# Patient Record
Sex: Female | Born: 1935 | Race: White | Hispanic: No | Smoking: Never smoker
Health system: Southern US, Community
[De-identification: ages and names within clinical notes are randomized; demographics above are authoritative.]

## PROBLEM LIST (undated history)

## (undated) DIAGNOSIS — G459 Transient cerebral ischemic attack, unspecified: Secondary | ICD-10-CM

## (undated) DIAGNOSIS — I10 Essential (primary) hypertension: Secondary | ICD-10-CM

## (undated) DIAGNOSIS — M1990 Unspecified osteoarthritis, unspecified site: Secondary | ICD-10-CM

## (undated) DIAGNOSIS — F0390 Unspecified dementia without behavioral disturbance: Secondary | ICD-10-CM

## (undated) DIAGNOSIS — I2510 Atherosclerotic heart disease of native coronary artery without angina pectoris: Secondary | ICD-10-CM

## (undated) DIAGNOSIS — I639 Cerebral infarction, unspecified: Secondary | ICD-10-CM

## (undated) DIAGNOSIS — E7800 Pure hypercholesterolemia, unspecified: Secondary | ICD-10-CM

## (undated) DIAGNOSIS — K219 Gastro-esophageal reflux disease without esophagitis: Secondary | ICD-10-CM

## (undated) HISTORY — PX: CORONARY ANGIOPLASTY WITH STENT PLACEMENT: SHX49

## (undated) HISTORY — PX: BACK SURGERY: SHX140

## (undated) HISTORY — PX: CHOLECYSTECTOMY: SHX55

## (undated) HISTORY — PX: TONSILLECTOMY: SUR1361

## (undated) HISTORY — PX: SPINAL FUSION: SHX223

## (undated) HISTORY — PX: ABDOMINAL HYSTERECTOMY: SHX81

## (undated) HISTORY — PX: FRACTURE SURGERY: SHX138

---

## 2004-08-26 ENCOUNTER — Ambulatory Visit: Payer: Self-pay

## 2005-01-20 ENCOUNTER — Ambulatory Visit: Payer: Self-pay

## 2005-01-21 ENCOUNTER — Ambulatory Visit: Payer: Self-pay | Admitting: Unknown Physician Specialty

## 2006-02-25 ENCOUNTER — Ambulatory Visit: Payer: Self-pay

## 2008-09-11 ENCOUNTER — Emergency Department: Payer: Self-pay | Admitting: Emergency Medicine

## 2010-02-05 ENCOUNTER — Ambulatory Visit: Payer: Self-pay

## 2010-06-18 ENCOUNTER — Emergency Department: Payer: Self-pay | Admitting: Emergency Medicine

## 2011-04-24 ENCOUNTER — Emergency Department: Payer: Self-pay | Admitting: Emergency Medicine

## 2011-04-24 LAB — URINALYSIS, COMPLETE
Glucose,UR: NEGATIVE mg/dL (ref 0–75)
Nitrite: POSITIVE
Ph: 5 (ref 4.5–8.0)
Protein: NEGATIVE
Specific Gravity: 1.011 (ref 1.003–1.030)
WBC UR: 8 /HPF (ref 0–5)

## 2011-04-24 LAB — COMPREHENSIVE METABOLIC PANEL
Anion Gap: 8 (ref 7–16)
BUN: 20 mg/dL — ABNORMAL HIGH (ref 7–18)
Bilirubin,Total: 0.5 mg/dL (ref 0.2–1.0)
Chloride: 108 mmol/L — ABNORMAL HIGH (ref 98–107)
Co2: 26 mmol/L (ref 21–32)
Creatinine: 0.91 mg/dL (ref 0.60–1.30)
EGFR (African American): >60
EGFR (Non-African Amer.): >60
Osmolality: 286 (ref 275–301)
Potassium: 4 mmol/L (ref 3.5–5.1)
SGPT (ALT): 16 U/L
Sodium: 142 mmol/L (ref 136–145)
Total Protein: 7.1 g/dL (ref 6.4–8.2)

## 2011-04-24 LAB — CBC
HCT: 37.5 % (ref 35.0–47.0)
MCHC: 33.7 g/dL (ref 32.0–36.0)
MCV: 98 fL (ref 80–100)
RBC: 3.83 10*6/uL (ref 3.80–5.20)
RDW: 14.3 % (ref 11.5–14.5)
WBC: 10.6 10*3/uL (ref 3.6–11.0)

## 2011-04-24 LAB — TROPONIN I: Troponin-I: <0.02 ng/mL

## 2012-03-09 ENCOUNTER — Emergency Department: Payer: Self-pay | Admitting: Emergency Medicine

## 2014-07-03 DIAGNOSIS — H02403 Unspecified ptosis of bilateral eyelids: Secondary | ICD-10-CM | POA: Diagnosis not present

## 2014-07-03 DIAGNOSIS — N3946 Mixed incontinence: Secondary | ICD-10-CM | POA: Diagnosis not present

## 2014-07-03 DIAGNOSIS — I10 Essential (primary) hypertension: Secondary | ICD-10-CM | POA: Diagnosis not present

## 2014-07-03 DIAGNOSIS — J309 Allergic rhinitis, unspecified: Secondary | ICD-10-CM | POA: Diagnosis not present

## 2014-07-03 DIAGNOSIS — F0280 Dementia in other diseases classified elsewhere without behavioral disturbance: Secondary | ICD-10-CM | POA: Diagnosis not present

## 2014-11-05 DIAGNOSIS — H02403 Unspecified ptosis of bilateral eyelids: Secondary | ICD-10-CM | POA: Diagnosis not present

## 2014-11-05 DIAGNOSIS — F0280 Dementia in other diseases classified elsewhere without behavioral disturbance: Secondary | ICD-10-CM | POA: Diagnosis not present

## 2014-11-05 DIAGNOSIS — N3946 Mixed incontinence: Secondary | ICD-10-CM | POA: Diagnosis not present

## 2014-11-05 DIAGNOSIS — J309 Allergic rhinitis, unspecified: Secondary | ICD-10-CM | POA: Diagnosis not present

## 2014-11-05 DIAGNOSIS — I10 Essential (primary) hypertension: Secondary | ICD-10-CM | POA: Diagnosis not present

## 2015-02-06 ENCOUNTER — Encounter: Payer: Self-pay | Admitting: Emergency Medicine

## 2015-02-06 ENCOUNTER — Emergency Department
Admission: EM | Admit: 2015-02-06 | Discharge: 2015-02-06 | Disposition: A | Discharge status: Home / Self Care | Payer: Commercial Managed Care - HMO | Attending: Student | Admitting: Student

## 2015-02-06 ENCOUNTER — Emergency Department: Payer: Commercial Managed Care - HMO

## 2015-02-06 DIAGNOSIS — S52021A Displaced fracture of olecranon process without intraarticular extension of right ulna, initial encounter for closed fracture: Secondary | ICD-10-CM

## 2015-02-06 DIAGNOSIS — Y9289 Other specified places as the place of occurrence of the external cause: Secondary | ICD-10-CM | POA: Diagnosis not present

## 2015-02-06 DIAGNOSIS — Z79899 Other long term (current) drug therapy: Secondary | ICD-10-CM | POA: Diagnosis not present

## 2015-02-06 DIAGNOSIS — R42 Dizziness and giddiness: Secondary | ICD-10-CM | POA: Insufficient documentation

## 2015-02-06 DIAGNOSIS — Y998 Other external cause status: Secondary | ICD-10-CM | POA: Diagnosis not present

## 2015-02-06 DIAGNOSIS — S52024A Nondisplaced fracture of olecranon process without intraarticular extension of right ulna, initial encounter for closed fracture: Secondary | ICD-10-CM | POA: Diagnosis not present

## 2015-02-06 DIAGNOSIS — S199XXA Unspecified injury of neck, initial encounter: Secondary | ICD-10-CM | POA: Diagnosis not present

## 2015-02-06 DIAGNOSIS — W01198A Fall on same level from slipping, tripping and stumbling with subsequent striking against other object, initial encounter: Secondary | ICD-10-CM | POA: Insufficient documentation

## 2015-02-06 DIAGNOSIS — R079 Chest pain, unspecified: Secondary | ICD-10-CM | POA: Diagnosis not present

## 2015-02-06 DIAGNOSIS — S0121XA Laceration without foreign body of nose, initial encounter: Secondary | ICD-10-CM | POA: Diagnosis not present

## 2015-02-06 DIAGNOSIS — I10 Essential (primary) hypertension: Secondary | ICD-10-CM | POA: Diagnosis not present

## 2015-02-06 DIAGNOSIS — Y9389 Activity, other specified: Secondary | ICD-10-CM | POA: Diagnosis not present

## 2015-02-06 DIAGNOSIS — S022XXA Fracture of nasal bones, initial encounter for closed fracture: Secondary | ICD-10-CM

## 2015-02-06 DIAGNOSIS — W19XXXA Unspecified fall, initial encounter: Secondary | ICD-10-CM

## 2015-02-06 DIAGNOSIS — R102 Pelvic and perineal pain: Secondary | ICD-10-CM | POA: Diagnosis not present

## 2015-02-06 DIAGNOSIS — S5001XA Contusion of right elbow, initial encounter: Secondary | ICD-10-CM | POA: Diagnosis not present

## 2015-02-06 DIAGNOSIS — N390 Urinary tract infection, site not specified: Secondary | ICD-10-CM

## 2015-02-06 HISTORY — DX: Unspecified dementia, unspecified severity, without behavioral disturbance, psychotic disturbance, mood disturbance, and anxiety: F03.90

## 2015-02-06 HISTORY — DX: Essential (primary) hypertension: I10

## 2015-02-06 HISTORY — DX: Transient cerebral ischemic attack, unspecified: G45.9

## 2015-02-06 HISTORY — DX: Pure hypercholesterolemia, unspecified: E78.00

## 2015-02-06 LAB — COMPREHENSIVE METABOLIC PANEL
ALK PHOS: 62 U/L (ref 38–126)
ALT: 9 U/L — AB (ref 14–54)
AST: 17 U/L (ref 15–41)
Albumin: 3.8 g/dL (ref 3.5–5.0)
Anion gap: 7 (ref 5–15)
BUN: 21 mg/dL — AB (ref 6–20)
CALCIUM: 9 mg/dL (ref 8.9–10.3)
CO2: 24 mmol/L (ref 22–32)
CREATININE: 0.89 mg/dL (ref 0.44–1.00)
Chloride: 110 mmol/L (ref 101–111)
GFR, EST NON AFRICAN AMERICAN: 54 mL/min — AB (ref >60)
Glucose, Bld: 117 mg/dL — ABNORMAL HIGH (ref 65–99)
Potassium: 3.7 mmol/L (ref 3.5–5.1)
Sodium: 141 mmol/L (ref 135–145)
Total Bilirubin: 0.4 mg/dL (ref 0.3–1.2)
Total Protein: 6.8 g/dL (ref 6.5–8.1)

## 2015-02-06 LAB — URINALYSIS COMPLETE WITH MICROSCOPIC (ARMC ONLY)
BILIRUBIN URINE: NEGATIVE
Glucose, UA: NEGATIVE mg/dL
KETONES UR: NEGATIVE mg/dL
LEUKOCYTES UA: NEGATIVE
Nitrite: POSITIVE — AB
PH: 5 (ref 5.0–8.0)
PROTEIN: NEGATIVE mg/dL
Specific Gravity, Urine: 1.015 (ref 1.005–1.030)

## 2015-02-06 LAB — TROPONIN I

## 2015-02-06 LAB — CBC
HEMATOCRIT: 36.6 % (ref 35.0–47.0)
HEMOGLOBIN: 12.2 g/dL (ref 12.0–16.0)
MCH: 32.5 pg (ref 26.0–34.0)
MCHC: 33.4 g/dL (ref 32.0–36.0)
MCV: 97.2 fL (ref 80.0–100.0)
Platelets: 305 10*3/uL (ref 150–440)
RBC: 3.76 MIL/uL — AB (ref 3.80–5.20)
RDW: 13.3 % (ref 11.5–14.5)
WBC: 15.6 10*3/uL — ABNORMAL HIGH (ref 3.6–11.0)

## 2015-02-06 MED ORDER — ACETAMINOPHEN 325 MG PO TABS: 650.0000 mg | ORAL_TABLET | Freq: Four times a day (QID) | ORAL | Status: AC | PRN

## 2015-02-06 MED ORDER — LIDOCAINE HCL (PF) 1 % IJ SOLN
5.0000 mL | Freq: Once | INTRAMUSCULAR | Status: AC
  Administered 2015-02-06: 5 mL

## 2015-02-06 MED ORDER — LIDOCAINE HCL (PF) 1 % IJ SOLN
INTRAMUSCULAR | Status: AC
  Administered 2015-02-06: 5 mL
  Filled 2015-02-06: qty 5

## 2015-02-06 MED ORDER — ACETAMINOPHEN 325 MG PO TABS
650.0000 mg | ORAL_TABLET | Freq: Once | ORAL | Status: AC
  Administered 2015-02-06: 650 mg via ORAL
  Filled 2015-02-06: qty 2

## 2015-02-06 MED ORDER — CEPHALEXIN 500 MG PO CAPS
500.0000 mg | ORAL_CAPSULE | Freq: Once | ORAL | Status: AC
  Administered 2015-02-06: 500 mg via ORAL
  Filled 2015-02-06: qty 1

## 2015-02-06 MED ORDER — CEPHALEXIN 500 MG PO CAPS: 500.0000 mg | ORAL_CAPSULE | Freq: Four times a day (QID) | ORAL | Status: DC

## 2015-02-06 MED ORDER — OXYMETAZOLINE HCL 0.05 % NA SOLN
1.0000 | Freq: Once | NASAL | Status: AC
  Administered 2015-02-06: 1 via NASAL
  Filled 2015-02-06: qty 15

## 2015-02-06 MED ORDER — IBUPROFEN 400 MG PO TABS
400.0000 mg | ORAL_TABLET | Freq: Once | ORAL | Status: AC
  Administered 2015-02-06: 400 mg via ORAL
  Filled 2015-02-06: qty 1

## 2015-02-06 NOTE — ED Notes (Signed)
NAD noted at this time. Pt's son at bedside. Pt resting quietly in bed, interactions appropriate with staff at this time. Respirations even and unlabored.

## 2015-02-06 NOTE — ED Notes (Signed)
Pt presents to ED with son c/o fall. Son reports pt woke up to use the bathroom this morning about 05:10 and fell, pt states she felt dizzy upon getting up from bed. Laceration noted under the nose, hematoma to right elbow, and swelling to right corner of mouth. Pt alert, oriented to person, place, and situation. Pt has hx of dementia. No increased work in breathing noted. Son at bedside. Call bell within reach.

## 2015-02-06 NOTE — ED Notes (Signed)
NAD noted at this time. Pt resting in bed with son at bedside. Pt calm and cooperative with staff at this time. Respirations even and unlabored.

## 2015-02-06 NOTE — ED Notes (Signed)
Pt presents to ED with right elbow pain with hematoma present, laceration to the underside of her nose, and swelling to the right side of her bottom lip. Pt states she fell to the floor while trying to get out of bed this morning due to feeling dizzy. Pt alert and denies any other injury. Answers questions appropriately. Bleeding noted from nose and mouth.

## 2015-02-06 NOTE — ED Notes (Signed)
MD at bedside for laceration repair.

## 2015-02-06 NOTE — ED Notes (Signed)
NAD noted at this time. Pt taken to lobby via wheelchair by her son.

## 2015-02-06 NOTE — ED Provider Notes (Signed)
[facility] Emergency Department Provider Note  ____________________________________________  Time seen: Approximately 7:24 AM  I have reviewed the triage vital signs and the nursing notes.   HISTORY  Chief Complaint Laceration; Fall; and Dizziness    HPI Patricia Daniels is a 81 y.o. female with history of dementia, hypertension, hypercholesterolemia presents for evaluation after fall. The patient reports she fell while she was trying to get out of bed to go to the bathroom. Initially she stated that she felt dizzy however son reports that it appears she may have slipped on a shirt that was on the floor. She denies losing consciousness. She is complaining of head and face pain, neck pain, right elbow pain. Pain is moderate to severe and has been constant since its then onset. It is worse with movement of the affected areas. She has otherwise been in her usual state of health without illness. No cough, sneezing, runny nose, congestion, vomiting, diarrhea, fevers or chills. Her son reports that her tetanus vaccine is up-to-date.   Past Medical History  Diagnosis Date  . Dementia   . Hypercholesteremia   . TIA (transient ischemic attack)   . Hypertension     There are no active problems to display for this patient.   Past Surgical History  Procedure Laterality Date  . Abdominal hysterectomy      Current Outpatient Rx  Name  Route  Sig  Dispense  Refill  . cetirizine (ZYRTEC) 10 MG tablet   Oral   Take 10 mg by mouth daily.         Marland Kitchen. donepezil (ARICEPT) 10 MG tablet   Oral   Take 10 mg by mouth at bedtime.         Marland Kitchen. lisinopril (PRINIVIL,ZESTRIL) 5 MG tablet   Oral   Take 5 mg by mouth daily.         . metoprolol succinate (TOPROL-XL) 25 MG 24 hr tablet   Oral   Take 25 mg by mouth daily.         Marland Kitchen. topiramate (TOPAMAX) 25 MG tablet   Oral   Take 25 mg by mouth at bedtime.           Allergies Codeine  No family history on  file.  Social History Social History  Substance Use Topics  . Smoking status: Never Smoker   . Smokeless tobacco: None  . Alcohol Use: No    Review of Systems Constitutional: No fever/chills Eyes: No visual changes. ENT: No sore throat. Cardiovascular: Denies chest pain. Respiratory: Denies shortness of breath. Gastrointestinal: No abdominal pain.  No nausea, no vomiting.  No diarrhea.  No constipation. Genitourinary: Negative for dysuria. Musculoskeletal: Negative for back pain. Skin: Negative for rash. Neurological: Negative for headaches, focal weakness or numbness.  10-point ROS otherwise negative.  ____________________________________________   PHYSICAL EXAM:  VITAL SIGNS: ED Triage Vitals  Enc Vitals Group     BP 02/06/15 0557 141/56 mmHg     Pulse Rate 02/06/15 0557 49     Resp 02/06/15 0557 20     Temp 02/06/15 0557 97.4 F (36.3 C)     Temp Source 02/06/15 0557 Oral     SpO2 02/06/15 0557 100 %     Weight 02/06/15 0557 123 lb (55.792 kg)     Height 02/06/15 0557 5\' 2"  (1.575 m)     Head Cir --      Peak Flow --      Pain Score 02/06/15 0559 8  Pain Loc --      Pain Edu? --      Excl. in GC? --     Constitutional: Alert and oriented. Well appearing and in no acute distress. Eyes: Conjunctivae are normal. PERRL. EOMI. Head: Atraumatic. Nose: Swelling, tenderness, ecchymosis to the nose with dried blood in the anterior naris bilaterally.Laceration of the columella. Mouth/Throat: Mucous membranes are moist.  Oropharynx non-erythematous. Dried blood at the corners of the mouth but no intraoral laceration or laceration of lips. Neck: No stridor.   Cardiovascular: Normal rate, regular rhythm. Grossly normal heart sounds.  Good peripheral circulation. Respiratory: Normal respiratory effort.  No retractions. Lungs CTAB. Gastrointestinal: Soft and nontender. No distention. No CVA tenderness. Genitourinary: deferred Musculoskeletal: Hematoma with tenderness  associated with the right elbow. 2+ right radial pulse, no tenderness to palpation throughout the right snuffbox. Mild tenderness of the pelvis however the pelvis is stable to rock and compression. Faint tenderness of the anterior chest wall but no flail chest, no bony step-off or deformity. Neurologic:  Normal speech and language. No gross focal neurologic deficits are appreciated. 5 out of 5 strength in bilateral upper and lower extremity, sensation intact to light touch throughout. Skin:  Skin is warm, dry. No rash noted. Psychiatric: Mood and affect are normal. Speech and behavior are normal.  ____________________________________________   LABS (all labs ordered are listed, but only abnormal results are displayed)  Labs Reviewed  CBC - Abnormal; Notable for the following:    WBC 15.6 (*)    RBC 3.76 (*)    All other components within normal limits  COMPREHENSIVE METABOLIC PANEL - Abnormal; Notable for the following:    Glucose, Bld 117 (*)    BUN 21 (*)    ALT 9 (*)    GFR calc non Af Amer 54 (*)    All other components within normal limits  URINALYSIS COMPLETEWITH MICROSCOPIC ([facility] ONLY) - Abnormal; Notable for the following:    Color, Urine YELLOW (*)    APPearance CLEAR (*)    Hgb urine dipstick 2+ (*)    Nitrite POSITIVE (*)    Bacteria, UA MANY (*)    Squamous Epithelial / LPF 0-5 (*)    All other components within normal limits  TROPONIN I   ____________________________________________  EKG  ED ECG REPORT I, Gayla Doss, the attending physician, personally viewed and interpreted this ECG.   Date: 02/06/2015  EKG Time: 06:14  Rate: 49  Rhythm: sinus bradycardia  Axis: normal  Intervals:none  ST&T Change: No acute ST elevation. T-wave flat in V3. T-wave inversion in V2.  ____________________________________________  RADIOLOGY  CT head, maxillofacial, c-spine IMPRESSION: Acute on chronic nasal bone fractures. No other acute abnormality is seen in the  face, head or cervical spine.  Atrophy and chronic microvascular ischemic change.  Multilevel cervical spondylosis.  CXR IMPRESSION: No acute cardiopulmonary abnormality seen.  Pelvis xray IMPRESSION: No significant abnormality seen in the pelvis.   Right elbow xray IMPRESSION: Small nondisplaced acute olecranon fracture without dislocation.  Small elbow effusion. Olecranon soft tissue swelling.  ____________________________________________   PROCEDURES  Procedure(s) performed:  LACERATION REPAIR Performed by: Gayla Doss Authorized by: Toney Rakes A Consent: Verbal consent obtained. Risks and benefits: risks, benefits and alternatives were discussed Consent given by: patient Patient identity confirmed: provided demographic data Prepped and Draped in normal sterile fashion Wound explored  Laceration Location: columella  Laceration Length: less than 1cm  No Foreign Bodies seen or palpated  Anesthesia: local infiltration  Local anesthetic: lidocaine 1% without epinephrine  Anesthetic total: 3 ml  Irrigation method: syringe Amount of cleaning: standard  Skin closure: 4-0 prolene  Number of sutures: 3  Technique: Simple interrupted   Patient tolerance: Patient tolerated the procedure well with no immediate complications.   Critical Care performed: No  ____________________________________________   INITIAL IMPRESSION / ASSESSMENT AND PLAN / ED COURSE  Pertinent labs & imaging results that were available during my care of the patient were reviewed by me and considered in my medical decision making (see chart for details).  Patricia Daniels is a 81 y.o. female with history of dementia, hypertension, hypercholesterolemia presents for evaluation after fall which is possibly mechanical in origin though triage note reports that she did complain of some dizziness. On exam, she is nontoxic appearing and in no acute distress. EKG shows sinus bradycardia, she  is maintaining adequate blood pressure and is on metoprolol and I suspect this may be a normal heart rate for her. Plan for screening labs, laceration repair, CT head and maxillofacial, C-spine as well as plain films of the right elbow chest and pelvis. We'll treat her pain.  ----------------------------------------- 10:03 AM on 02/06/2015 -----------------------------------------  Columella laceration repaired. Labs reviewed and are notable for nitrite positive urinary tract infection, she also has leukocytosis. CMP unremarkable. Troponin negative. Plain films of the chest and pelvis are negative for any acute traumatic pathology. CT head and C-spine without any acute cranial process or C-spine fracture/dislocation. CT maxillofacial shows acute on chronic nasal bone fractures. She was given aspirin with resolution of the bleeding from her nose. Will follow up with ENT. I discussed the case with Dr. Trilby Drummer orthopedic surgery given playful to the elbow show a nondisplaced olecranon fracture. He recommends no splinting, sling and follow-up with or throat in one week. We'll give Keflex for urinary tract infection. I discussed return precautions and need for close follow-up with the patient and her son at bedside and all are comfortable with the discharge plan. ____________________________________________   FINAL CLINICAL IMPRESSION(S) / ED DIAGNOSES  Final diagnoses:  Fall, initial encounter  Nasal bone fracture, closed, initial encounter  Olecranon fracture, right, closed, initial encounter  UTI (lower urinary tract infection)      Gayla Doss, MD 02/06/15 1008

## 2015-02-06 NOTE — ED Notes (Signed)
NAD noted at this time. Pt resting in bed with son at bedside. Pt noted to have eyes closed but opens with mild stimuli.

## 2015-02-12 DIAGNOSIS — M25521 Pain in right elbow: Secondary | ICD-10-CM | POA: Diagnosis not present

## 2015-02-12 DIAGNOSIS — S52024A Nondisplaced fracture of olecranon process without intraarticular extension of right ulna, initial encounter for closed fracture: Secondary | ICD-10-CM | POA: Diagnosis not present

## 2015-02-13 DIAGNOSIS — S022XXA Fracture of nasal bones, initial encounter for closed fracture: Secondary | ICD-10-CM | POA: Diagnosis not present

## 2015-02-13 DIAGNOSIS — J342 Deviated nasal septum: Secondary | ICD-10-CM | POA: Diagnosis not present

## 2015-02-28 DIAGNOSIS — S52024D Nondisplaced fracture of olecranon process without intraarticular extension of right ulna, subsequent encounter for closed fracture with routine healing: Secondary | ICD-10-CM | POA: Diagnosis not present

## 2015-02-28 DIAGNOSIS — M25521 Pain in right elbow: Secondary | ICD-10-CM | POA: Diagnosis not present

## 2015-02-28 DIAGNOSIS — G8929 Other chronic pain: Secondary | ICD-10-CM | POA: Diagnosis not present

## 2015-03-11 DIAGNOSIS — N3946 Mixed incontinence: Secondary | ICD-10-CM | POA: Diagnosis not present

## 2015-03-11 DIAGNOSIS — F0280 Dementia in other diseases classified elsewhere without behavioral disturbance: Secondary | ICD-10-CM | POA: Diagnosis not present

## 2015-03-11 DIAGNOSIS — N390 Urinary tract infection, site not specified: Secondary | ICD-10-CM | POA: Diagnosis not present

## 2015-03-11 DIAGNOSIS — I10 Essential (primary) hypertension: Secondary | ICD-10-CM | POA: Diagnosis not present

## 2015-03-11 DIAGNOSIS — S52026S Nondisplaced fracture of olecranon process without intraarticular extension of unspecified ulna, sequela: Secondary | ICD-10-CM | POA: Diagnosis not present

## 2015-03-15 DIAGNOSIS — I10 Essential (primary) hypertension: Secondary | ICD-10-CM | POA: Diagnosis not present

## 2015-03-15 DIAGNOSIS — I2510 Atherosclerotic heart disease of native coronary artery without angina pectoris: Secondary | ICD-10-CM | POA: Diagnosis not present

## 2015-03-15 DIAGNOSIS — M6281 Muscle weakness (generalized): Secondary | ICD-10-CM | POA: Diagnosis not present

## 2015-03-15 DIAGNOSIS — S52026S Nondisplaced fracture of olecranon process without intraarticular extension of unspecified ulna, sequela: Secondary | ICD-10-CM | POA: Diagnosis not present

## 2015-03-15 DIAGNOSIS — F0390 Unspecified dementia without behavioral disturbance: Secondary | ICD-10-CM | POA: Diagnosis not present

## 2015-03-15 DIAGNOSIS — Z9181 History of falling: Secondary | ICD-10-CM | POA: Diagnosis not present

## 2015-03-19 DIAGNOSIS — Z9181 History of falling: Secondary | ICD-10-CM | POA: Diagnosis not present

## 2015-03-19 DIAGNOSIS — S52026S Nondisplaced fracture of olecranon process without intraarticular extension of unspecified ulna, sequela: Secondary | ICD-10-CM | POA: Diagnosis not present

## 2015-03-19 DIAGNOSIS — I10 Essential (primary) hypertension: Secondary | ICD-10-CM | POA: Diagnosis not present

## 2015-03-19 DIAGNOSIS — M6281 Muscle weakness (generalized): Secondary | ICD-10-CM | POA: Diagnosis not present

## 2015-03-19 DIAGNOSIS — I2510 Atherosclerotic heart disease of native coronary artery without angina pectoris: Secondary | ICD-10-CM | POA: Diagnosis not present

## 2015-03-19 DIAGNOSIS — F0390 Unspecified dementia without behavioral disturbance: Secondary | ICD-10-CM | POA: Diagnosis not present

## 2015-03-21 DIAGNOSIS — G8929 Other chronic pain: Secondary | ICD-10-CM | POA: Diagnosis not present

## 2015-03-21 DIAGNOSIS — S52024D Nondisplaced fracture of olecranon process without intraarticular extension of right ulna, subsequent encounter for closed fracture with routine healing: Secondary | ICD-10-CM | POA: Diagnosis not present

## 2015-03-21 DIAGNOSIS — M25521 Pain in right elbow: Secondary | ICD-10-CM | POA: Diagnosis not present

## 2015-03-26 DIAGNOSIS — Z9181 History of falling: Secondary | ICD-10-CM | POA: Diagnosis not present

## 2015-03-26 DIAGNOSIS — I2510 Atherosclerotic heart disease of native coronary artery without angina pectoris: Secondary | ICD-10-CM | POA: Diagnosis not present

## 2015-03-26 DIAGNOSIS — S52026S Nondisplaced fracture of olecranon process without intraarticular extension of unspecified ulna, sequela: Secondary | ICD-10-CM | POA: Diagnosis not present

## 2015-03-26 DIAGNOSIS — M6281 Muscle weakness (generalized): Secondary | ICD-10-CM | POA: Diagnosis not present

## 2015-03-26 DIAGNOSIS — F0390 Unspecified dementia without behavioral disturbance: Secondary | ICD-10-CM | POA: Diagnosis not present

## 2015-03-26 DIAGNOSIS — I10 Essential (primary) hypertension: Secondary | ICD-10-CM | POA: Diagnosis not present

## 2015-04-03 DIAGNOSIS — F0390 Unspecified dementia without behavioral disturbance: Secondary | ICD-10-CM | POA: Diagnosis not present

## 2015-04-03 DIAGNOSIS — I10 Essential (primary) hypertension: Secondary | ICD-10-CM | POA: Diagnosis not present

## 2015-04-03 DIAGNOSIS — M6281 Muscle weakness (generalized): Secondary | ICD-10-CM | POA: Diagnosis not present

## 2015-04-03 DIAGNOSIS — I2510 Atherosclerotic heart disease of native coronary artery without angina pectoris: Secondary | ICD-10-CM | POA: Diagnosis not present

## 2015-04-03 DIAGNOSIS — S52026S Nondisplaced fracture of olecranon process without intraarticular extension of unspecified ulna, sequela: Secondary | ICD-10-CM | POA: Diagnosis not present

## 2015-04-03 DIAGNOSIS — Z9181 History of falling: Secondary | ICD-10-CM | POA: Diagnosis not present

## 2015-04-09 DIAGNOSIS — H02403 Unspecified ptosis of bilateral eyelids: Secondary | ICD-10-CM | POA: Diagnosis not present

## 2015-04-09 DIAGNOSIS — N3946 Mixed incontinence: Secondary | ICD-10-CM | POA: Diagnosis not present

## 2015-04-09 DIAGNOSIS — I10 Essential (primary) hypertension: Secondary | ICD-10-CM | POA: Diagnosis not present

## 2015-04-09 DIAGNOSIS — Z0001 Encounter for general adult medical examination with abnormal findings: Secondary | ICD-10-CM | POA: Diagnosis not present

## 2015-04-09 DIAGNOSIS — F0280 Dementia in other diseases classified elsewhere without behavioral disturbance: Secondary | ICD-10-CM | POA: Diagnosis not present

## 2015-04-09 DIAGNOSIS — Z23 Encounter for immunization: Secondary | ICD-10-CM | POA: Diagnosis not present

## 2015-04-09 DIAGNOSIS — S52026S Nondisplaced fracture of olecranon process without intraarticular extension of unspecified ulna, sequela: Secondary | ICD-10-CM | POA: Diagnosis not present

## 2015-04-12 DIAGNOSIS — Z9181 History of falling: Secondary | ICD-10-CM | POA: Diagnosis not present

## 2015-04-12 DIAGNOSIS — F0390 Unspecified dementia without behavioral disturbance: Secondary | ICD-10-CM | POA: Diagnosis not present

## 2015-04-12 DIAGNOSIS — I10 Essential (primary) hypertension: Secondary | ICD-10-CM | POA: Diagnosis not present

## 2015-04-12 DIAGNOSIS — M6281 Muscle weakness (generalized): Secondary | ICD-10-CM | POA: Diagnosis not present

## 2015-04-12 DIAGNOSIS — S52026S Nondisplaced fracture of olecranon process without intraarticular extension of unspecified ulna, sequela: Secondary | ICD-10-CM | POA: Diagnosis not present

## 2015-04-12 DIAGNOSIS — I2510 Atherosclerotic heart disease of native coronary artery without angina pectoris: Secondary | ICD-10-CM | POA: Diagnosis not present

## 2015-04-17 DIAGNOSIS — I10 Essential (primary) hypertension: Secondary | ICD-10-CM | POA: Diagnosis not present

## 2015-04-17 DIAGNOSIS — F0390 Unspecified dementia without behavioral disturbance: Secondary | ICD-10-CM | POA: Diagnosis not present

## 2015-04-18 DIAGNOSIS — Z9181 History of falling: Secondary | ICD-10-CM | POA: Diagnosis not present

## 2015-04-18 DIAGNOSIS — I2510 Atherosclerotic heart disease of native coronary artery without angina pectoris: Secondary | ICD-10-CM | POA: Diagnosis not present

## 2015-04-18 DIAGNOSIS — M6281 Muscle weakness (generalized): Secondary | ICD-10-CM | POA: Diagnosis not present

## 2015-04-18 DIAGNOSIS — I10 Essential (primary) hypertension: Secondary | ICD-10-CM | POA: Diagnosis not present

## 2015-04-18 DIAGNOSIS — F0390 Unspecified dementia without behavioral disturbance: Secondary | ICD-10-CM | POA: Diagnosis not present

## 2015-04-18 DIAGNOSIS — S52026S Nondisplaced fracture of olecranon process without intraarticular extension of unspecified ulna, sequela: Secondary | ICD-10-CM | POA: Diagnosis not present

## 2015-04-24 DIAGNOSIS — F0390 Unspecified dementia without behavioral disturbance: Secondary | ICD-10-CM | POA: Diagnosis not present

## 2015-04-24 DIAGNOSIS — M6281 Muscle weakness (generalized): Secondary | ICD-10-CM | POA: Diagnosis not present

## 2015-04-24 DIAGNOSIS — I2510 Atherosclerotic heart disease of native coronary artery without angina pectoris: Secondary | ICD-10-CM | POA: Diagnosis not present

## 2015-04-24 DIAGNOSIS — I10 Essential (primary) hypertension: Secondary | ICD-10-CM | POA: Diagnosis not present

## 2015-04-24 DIAGNOSIS — S52026S Nondisplaced fracture of olecranon process without intraarticular extension of unspecified ulna, sequela: Secondary | ICD-10-CM | POA: Diagnosis not present

## 2015-04-24 DIAGNOSIS — Z9181 History of falling: Secondary | ICD-10-CM | POA: Diagnosis not present

## 2015-07-08 DIAGNOSIS — I10 Essential (primary) hypertension: Secondary | ICD-10-CM | POA: Diagnosis not present

## 2015-07-08 DIAGNOSIS — F0390 Unspecified dementia without behavioral disturbance: Secondary | ICD-10-CM | POA: Diagnosis not present

## 2015-07-08 DIAGNOSIS — J301 Allergic rhinitis due to pollen: Secondary | ICD-10-CM | POA: Diagnosis not present

## 2015-07-08 DIAGNOSIS — N3946 Mixed incontinence: Secondary | ICD-10-CM | POA: Diagnosis not present

## 2015-10-07 DIAGNOSIS — F0390 Unspecified dementia without behavioral disturbance: Secondary | ICD-10-CM | POA: Diagnosis not present

## 2015-10-07 DIAGNOSIS — N3946 Mixed incontinence: Secondary | ICD-10-CM | POA: Diagnosis not present

## 2015-10-07 DIAGNOSIS — J301 Allergic rhinitis due to pollen: Secondary | ICD-10-CM | POA: Diagnosis not present

## 2015-10-07 DIAGNOSIS — I10 Essential (primary) hypertension: Secondary | ICD-10-CM | POA: Diagnosis not present

## 2015-11-18 DIAGNOSIS — M545 Low back pain: Secondary | ICD-10-CM | POA: Diagnosis not present

## 2015-11-18 DIAGNOSIS — M5136 Other intervertebral disc degeneration, lumbar region: Secondary | ICD-10-CM | POA: Diagnosis not present

## 2015-11-18 DIAGNOSIS — M6281 Muscle weakness (generalized): Secondary | ICD-10-CM | POA: Diagnosis not present

## 2015-11-18 DIAGNOSIS — M62830 Muscle spasm of back: Secondary | ICD-10-CM | POA: Diagnosis not present

## 2016-04-09 DIAGNOSIS — R011 Cardiac murmur, unspecified: Secondary | ICD-10-CM | POA: Diagnosis not present

## 2016-04-09 DIAGNOSIS — F0390 Unspecified dementia without behavioral disturbance: Secondary | ICD-10-CM | POA: Diagnosis not present

## 2016-04-09 DIAGNOSIS — Z0001 Encounter for general adult medical examination with abnormal findings: Secondary | ICD-10-CM | POA: Diagnosis not present

## 2016-04-09 DIAGNOSIS — J309 Allergic rhinitis, unspecified: Secondary | ICD-10-CM | POA: Diagnosis not present

## 2016-04-09 DIAGNOSIS — H1030 Unspecified acute conjunctivitis, unspecified eye: Secondary | ICD-10-CM | POA: Diagnosis not present

## 2016-04-09 DIAGNOSIS — I10 Essential (primary) hypertension: Secondary | ICD-10-CM | POA: Diagnosis not present

## 2016-06-08 ENCOUNTER — Emergency Department: Payer: Medicare HMO

## 2016-06-08 ENCOUNTER — Inpatient Hospital Stay
Admission: EM | Admit: 2016-06-08 | Discharge: 2016-06-11 | DRG: 310 | Disposition: A | Discharge status: Skilled Nursing Facility | Payer: Medicare HMO | Attending: Internal Medicine | Admitting: Internal Medicine

## 2016-06-08 ENCOUNTER — Encounter: Payer: Self-pay | Admitting: Emergency Medicine

## 2016-06-08 DIAGNOSIS — J4520 Mild intermittent asthma, uncomplicated: Secondary | ICD-10-CM | POA: Diagnosis not present

## 2016-06-08 DIAGNOSIS — R2681 Unsteadiness on feet: Secondary | ICD-10-CM | POA: Diagnosis present

## 2016-06-08 DIAGNOSIS — E7800 Pure hypercholesterolemia, unspecified: Secondary | ICD-10-CM | POA: Diagnosis not present

## 2016-06-08 DIAGNOSIS — Z79899 Other long term (current) drug therapy: Secondary | ICD-10-CM | POA: Diagnosis not present

## 2016-06-08 DIAGNOSIS — S82435A Nondisplaced oblique fracture of shaft of left fibula, initial encounter for closed fracture: Secondary | ICD-10-CM

## 2016-06-08 DIAGNOSIS — Z66 Do not resuscitate: Secondary | ICD-10-CM | POA: Diagnosis present

## 2016-06-08 DIAGNOSIS — R0902 Hypoxemia: Secondary | ICD-10-CM | POA: Diagnosis present

## 2016-06-08 DIAGNOSIS — E785 Hyperlipidemia, unspecified: Secondary | ICD-10-CM | POA: Diagnosis not present

## 2016-06-08 DIAGNOSIS — Z7401 Bed confinement status: Secondary | ICD-10-CM | POA: Diagnosis not present

## 2016-06-08 DIAGNOSIS — W1830XA Fall on same level, unspecified, initial encounter: Secondary | ICD-10-CM | POA: Diagnosis present

## 2016-06-08 DIAGNOSIS — S82832A Other fracture of upper and lower end of left fibula, initial encounter for closed fracture: Secondary | ICD-10-CM | POA: Diagnosis not present

## 2016-06-08 DIAGNOSIS — S82435D Nondisplaced oblique fracture of shaft of left fibula, subsequent encounter for closed fracture with routine healing: Secondary | ICD-10-CM | POA: Diagnosis not present

## 2016-06-08 DIAGNOSIS — I5030 Unspecified diastolic (congestive) heart failure: Secondary | ICD-10-CM | POA: Diagnosis not present

## 2016-06-08 DIAGNOSIS — W19XXXA Unspecified fall, initial encounter: Secondary | ICD-10-CM | POA: Diagnosis not present

## 2016-06-08 DIAGNOSIS — Z8673 Personal history of transient ischemic attack (TIA), and cerebral infarction without residual deficits: Secondary | ICD-10-CM | POA: Diagnosis not present

## 2016-06-08 DIAGNOSIS — S8265XA Nondisplaced fracture of lateral malleolus of left fibula, initial encounter for closed fracture: Secondary | ICD-10-CM | POA: Diagnosis not present

## 2016-06-08 DIAGNOSIS — R001 Bradycardia, unspecified: Secondary | ICD-10-CM | POA: Diagnosis not present

## 2016-06-08 DIAGNOSIS — Z7901 Long term (current) use of anticoagulants: Secondary | ICD-10-CM | POA: Diagnosis not present

## 2016-06-08 DIAGNOSIS — S82409A Unspecified fracture of shaft of unspecified fibula, initial encounter for closed fracture: Secondary | ICD-10-CM | POA: Diagnosis not present

## 2016-06-08 DIAGNOSIS — W19XXXD Unspecified fall, subsequent encounter: Secondary | ICD-10-CM | POA: Diagnosis not present

## 2016-06-08 DIAGNOSIS — Z90710 Acquired absence of both cervix and uterus: Secondary | ICD-10-CM

## 2016-06-08 DIAGNOSIS — E876 Hypokalemia: Secondary | ICD-10-CM | POA: Diagnosis present

## 2016-06-08 DIAGNOSIS — I10 Essential (primary) hypertension: Secondary | ICD-10-CM | POA: Diagnosis present

## 2016-06-08 DIAGNOSIS — S82432A Displaced oblique fracture of shaft of left fibula, initial encounter for closed fracture: Secondary | ICD-10-CM | POA: Diagnosis present

## 2016-06-08 DIAGNOSIS — Z4689 Encounter for fitting and adjustment of other specified devices: Secondary | ICD-10-CM | POA: Diagnosis not present

## 2016-06-08 DIAGNOSIS — K219 Gastro-esophageal reflux disease without esophagitis: Secondary | ICD-10-CM | POA: Diagnosis present

## 2016-06-08 DIAGNOSIS — Z885 Allergy status to narcotic agent status: Secondary | ICD-10-CM

## 2016-06-08 DIAGNOSIS — M25572 Pain in left ankle and joints of left foot: Secondary | ICD-10-CM | POA: Diagnosis present

## 2016-06-08 DIAGNOSIS — S82892A Other fracture of left lower leg, initial encounter for closed fracture: Secondary | ICD-10-CM | POA: Diagnosis not present

## 2016-06-08 DIAGNOSIS — F0390 Unspecified dementia without behavioral disturbance: Secondary | ICD-10-CM | POA: Diagnosis present

## 2016-06-08 LAB — CBC
HEMATOCRIT: 37.9 % (ref 35.0–47.0)
Hemoglobin: 13.1 g/dL (ref 12.0–16.0)
MCH: 32.3 pg (ref 26.0–34.0)
MCHC: 34.6 g/dL (ref 32.0–36.0)
MCV: 93.5 fL (ref 80.0–100.0)
PLATELETS: 323 10*3/uL (ref 150–440)
RBC: 4.06 MIL/uL (ref 3.80–5.20)
RDW: 13.4 % (ref 11.5–14.5)
WBC: 12.4 10*3/uL — ABNORMAL HIGH (ref 3.6–11.0)

## 2016-06-08 LAB — URINALYSIS, ROUTINE W REFLEX MICROSCOPIC
BACTERIA UA: NONE SEEN
BILIRUBIN URINE: NEGATIVE
GLUCOSE, UA: NEGATIVE mg/dL
KETONES UR: NEGATIVE mg/dL
LEUKOCYTES UA: NEGATIVE
NITRITE: NEGATIVE
PROTEIN: NEGATIVE mg/dL
Specific Gravity, Urine: 1.019 (ref 1.005–1.030)
pH: 5 (ref 5.0–8.0)

## 2016-06-08 LAB — BASIC METABOLIC PANEL
Anion gap: 9 (ref 5–15)
BUN: 19 mg/dL (ref 6–20)
CO2: 25 mmol/L (ref 22–32)
CREATININE: 1.02 mg/dL — AB (ref 0.44–1.00)
Calcium: 9 mg/dL (ref 8.9–10.3)
Chloride: 108 mmol/L (ref 101–111)
GFR calc non Af Amer: 45 mL/min — ABNORMAL LOW (ref >60)
GFR, EST AFRICAN AMERICAN: 52 mL/min — AB (ref >60)
Glucose, Bld: 132 mg/dL — ABNORMAL HIGH (ref 65–99)
Potassium: 3.7 mmol/L (ref 3.5–5.1)
Sodium: 142 mmol/L (ref 135–145)

## 2016-06-08 LAB — TROPONIN I
Troponin I: <0.03 ng/mL (ref <0.03)
Troponin I: <0.03 ng/mL (ref <0.03)

## 2016-06-08 MED ORDER — SODIUM CHLORIDE 0.9 % IV SOLN
INTRAVENOUS | Status: AC
  Administered 2016-06-08: 23:00:00 via INTRAVENOUS

## 2016-06-08 MED ORDER — OXYCODONE HCL 5 MG PO TABS
5.0000 mg | ORAL_TABLET | ORAL | Status: DC | PRN
  Administered 2016-06-09: 5 mg via ORAL
  Filled 2016-06-08: qty 1

## 2016-06-08 MED ORDER — PANTOPRAZOLE SODIUM 40 MG PO TBEC
40.0000 mg | DELAYED_RELEASE_TABLET | Freq: Every day | ORAL | Status: DC
  Administered 2016-06-08 – 2016-06-10 (×3): 40 mg via ORAL
  Filled 2016-06-08 (×3): qty 1

## 2016-06-08 MED ORDER — ACETAMINOPHEN 325 MG PO TABS
650.0000 mg | ORAL_TABLET | Freq: Four times a day (QID) | ORAL | Status: DC | PRN
  Administered 2016-06-08: 650 mg via ORAL
  Filled 2016-06-08: qty 2

## 2016-06-08 MED ORDER — MONTELUKAST SODIUM 10 MG PO TABS
10.0000 mg | ORAL_TABLET | Freq: Every day | ORAL | Status: DC
  Administered 2016-06-08 – 2016-06-10 (×3): 10 mg via ORAL
  Filled 2016-06-08 (×3): qty 1

## 2016-06-08 MED ORDER — INFLUENZA VAC SPLIT QUAD 0.5 ML IM SUSY: 0.5000 mL | PREFILLED_SYRINGE | INTRAMUSCULAR | Status: DC

## 2016-06-08 MED ORDER — DONEPEZIL HCL 5 MG PO TABS
10.0000 mg | ORAL_TABLET | Freq: Every day | ORAL | Status: DC
  Administered 2016-06-08 – 2016-06-10 (×3): 10 mg via ORAL
  Filled 2016-06-08 (×3): qty 2

## 2016-06-08 MED ORDER — HYDRALAZINE HCL 20 MG/ML IJ SOLN
5.0000 mg | INTRAMUSCULAR | Status: DC | PRN
  Administered 2016-06-08: 5 mg via INTRAVENOUS
  Filled 2016-06-08: qty 1

## 2016-06-08 MED ORDER — TOPIRAMATE 25 MG PO TABS
25.0000 mg | ORAL_TABLET | Freq: Every day | ORAL | Status: DC
  Administered 2016-06-08 – 2016-06-10 (×3): 25 mg via ORAL
  Filled 2016-06-08 (×3): qty 1

## 2016-06-08 MED ORDER — ACETAMINOPHEN 650 MG RE SUPP: 650.0000 mg | Freq: Four times a day (QID) | RECTAL | Status: DC | PRN

## 2016-06-08 MED ORDER — ENOXAPARIN SODIUM 30 MG/0.3ML ~~LOC~~ SOLN
30.0000 mg | Freq: Every day | SUBCUTANEOUS | Status: DC
  Administered 2016-06-08 – 2016-06-09 (×2): 30 mg via SUBCUTANEOUS
  Filled 2016-06-08 (×2): qty 0.3

## 2016-06-08 MED ORDER — LISINOPRIL 5 MG PO TABS
5.0000 mg | ORAL_TABLET | Freq: Every day | ORAL | Status: DC
  Administered 2016-06-08 – 2016-06-09 (×2): 5 mg via ORAL
  Filled 2016-06-08 (×2): qty 1

## 2016-06-08 MED ORDER — SODIUM CHLORIDE 0.9% FLUSH
3.0000 mL | Freq: Two times a day (BID) | INTRAVENOUS | Status: DC
  Administered 2016-06-08 – 2016-06-11 (×4): 3 mL via INTRAVENOUS

## 2016-06-08 MED ORDER — ONDANSETRON HCL 4 MG PO TABS: 4.0000 mg | ORAL_TABLET | Freq: Four times a day (QID) | ORAL | Status: DC | PRN

## 2016-06-08 MED ORDER — ONDANSETRON HCL 4 MG/2ML IJ SOLN: 4.0000 mg | Freq: Four times a day (QID) | INTRAMUSCULAR | Status: DC | PRN

## 2016-06-08 NOTE — H&P (Signed)
[facility] [place] [facility]   PATIENT NAME: Patricia Daniels    MR#:  [id number]  DATE OF BIRTH:  07-06-1919  DATE OF ADMISSION:  06/08/2016  PRIMARY CARE PHYSICIAN: No PCP Per Patient   REQUESTING/REFERRING PHYSICIAN: York CeriseForbach, MD  CHIEF COMPLAINT:   Chief Complaint  Patient presents with  . Fall    HISTORY OF PRESENT ILLNESS:  Patricia Daniels  is a 83 y.o. female who presents with Fall, Fibula fracture. Here in the ED the patient found to be bradycardic and had a transient episode of hypoxia. Patient has dementia and is unable to contribute much to her history of present illness. However, in speaking with her son it seems that she is generally unsteady on her feet, and tends to wear socks on hard floors in her house. Seems like this was very likely a mechanical fall. Even so given her vital signs and history hospitalists were called for admission and further evaluation  PAST MEDICAL HISTORY:   Past Medical History:  Diagnosis Date  . Dementia   . Hypercholesteremia   . Hypertension   . TIA (transient ischemic attack)     PAST SURGICAL HISTORY:   Past Surgical History:  Procedure Laterality Date  . ABDOMINAL HYSTERECTOMY      SOCIAL HISTORY:   Social History  Substance Use Topics  . Smoking status: Never Smoker  . Smokeless tobacco: Not on file  . Alcohol use No    FAMILY HISTORY:  No family history on file.  DRUG ALLERGIES:   Allergies  Allergen Reactions  . Codeine Other (See Comments)    Son states it makes her "not feel good"    MEDICATIONS AT HOME:   Prior to Admission medications   Medication Sig Start Date End Date Taking? Authorizing Provider  cetirizine (ZYRTEC) 10 MG tablet Take 10 mg by mouth daily.   Yes Historical Provider, MD  donepezil (ARICEPT) 10 MG tablet Take 10 mg by mouth at bedtime.   Yes Historical Provider, MD  lisinopril (PRINIVIL,ZESTRIL) 5 MG tablet Take 5 mg by mouth daily.   Yes Historical Provider, MD   metoprolol succinate (TOPROL-XL) 25 MG 24 hr tablet Take 25 mg by mouth daily.   Yes Historical Provider, MD  montelukast (SINGULAIR) 10 MG tablet Take 10 mg by mouth at bedtime. 04/26/16  Yes Historical Provider, MD  pantoprazole (PROTONIX) 40 MG tablet Take 40 mg by mouth daily. 05/20/16  Yes Historical Provider, MD  topiramate (TOPAMAX) 25 MG tablet Take 25 mg by mouth at bedtime.   Yes Historical Provider, MD    REVIEW OF SYSTEMS:  Review of Systems  Unable to perform ROS: Dementia     VITAL SIGNS:   Vitals:   06/08/16 1930 06/08/16 1942 06/08/16 2000 06/08/16 2030  BP: (!) 191/92  (!) 158/137 (!) 177/78  Pulse: (!) 55 62 61 (!) 59  Resp:   (!) 24 (!) 26  Temp:      TempSrc:      SpO2: 97% 93% 90% 91%  Weight:      Height:       Wt Readings from Last 3 Encounters:  06/08/16 55.8 kg (123 lb)  02/06/15 55.8 kg (123 lb)    PHYSICAL EXAMINATION:  Physical Exam  Vitals reviewed. Constitutional: She appears well-developed and well-nourished. No distress.  HENT:  Head: Normocephalic and atraumatic.  Mouth/Throat: Oropharynx is clear and moist.  Eyes: Conjunctivae and EOM are normal. Pupils are equal, round, and reactive to  light. No scleral icterus.  Neck: Normal range of motion. Neck supple. No JVD present. No thyromegaly present.  Cardiovascular: Regular rhythm and intact distal pulses.  Exam reveals no gallop and no friction rub.   No murmur heard. Borderline bradycardic  Respiratory: Effort normal and breath sounds normal. No respiratory distress. She has no wheezes. She has no rales.  GI: Soft. Bowel sounds are normal. She exhibits no distension. There is no tenderness.  Musculoskeletal: Normal range of motion. She exhibits no edema.  Left lower extremity splinted  Lymphadenopathy:    She has no cervical adenopathy.  Neurological: She is alert. No cranial nerve deficit.  No dysarthria, no aphasia  Skin: Skin is warm and dry. No rash noted. No erythema.  Psychiatric:   Unable to fully assess due to dementia    LABORATORY PANEL:   CBC  Recent Labs Lab 06/08/16 1652  WBC 12.4*  HGB 13.1  HCT 37.9  PLT 323   ------------------------------------------------------------------------------------------------------------------  Chemistries   Recent Labs Lab 06/08/16 1652  NA 142  K 3.7  CL 108  CO2 25  GLUCOSE 132*  BUN 19  CREATININE 1.02*  CALCIUM 9.0   ------------------------------------------------------------------------------------------------------------------  Cardiac Enzymes  Recent Labs Lab 06/08/16 1652  TROPONINI <0.03   ------------------------------------------------------------------------------------------------------------------  RADIOLOGY:  Dg Ankle Complete Left  Result Date: 06/08/2016 CLINICAL DATA:  Status post fall.  Left ankle swelling EXAM: LEFT ANKLE COMPLETE - 3+ VIEW COMPARISON:  None. FINDINGS: Severe osteopenia. Oblique nondisplaced fracture of the distal fibular metaphysis. No other fracture or dislocation. Intact ankle mortise. Moderate osteoarthritis of the first tarsometatarsal joint. Mild osteoarthritis of the second and third tarsometatarsal joints. Peripheral vascular atherosclerotic disease. IMPRESSION: Acute oblique nondisplaced fracture of the distal fibular metaphysis. Electronically Signed   By: Elige Ko   On: 06/08/2016 18:11   Dg Chest Portable 1 View  Result Date: 06/08/2016 CLINICAL DATA:  Patient fell at 1 o'clock today. Sudden onset of bradycardia and hypoxemia. EXAM: PORTABLE CHEST 1 VIEW COMPARISON:  02/06/2015 CXR FINDINGS: Aortic atherosclerosis without aneurysm. Top normal-sized cardiac silhouette allowing for the AP projection. Both lungs are clear. Minimal atelectasis noted at the lung bases. No pleural effusion or pulmonary edema. No pneumothorax. Degenerative change of the dorsal spine and both shoulders. IMPRESSION: Aortic atherosclerosis.  Bibasilar atelectasis. Electronically  Signed   By: Tollie Eth M.D.   On: 06/08/2016 20:00    EKG:   Orders placed or performed during the hospital encounter of 06/08/16  . EKG 12-Lead  . EKG 12-Lead  . ED EKG  . ED EKG    IMPRESSION AND PLAN:  Principal Problem:   Bradycardia - patient is on metoprolol as a home med, it is unclear if she is normally blocked down to this heart rate or if this is something new. Her EKG showed sinus bradycardia with no other significant abnormalities. We will trend her troponins tonight, get an echocardiogram and a cardiology consult in the morning Active Problems:   Fall - likely mechanical in nature, however we will evaluate her cardiac status just the same given her bradycardia.   Closed fibular fracture - orthopedic surgery contacted by ED physician via phone and recommended splinting her lower extremity, orthopedic consult for the morning   HTN (hypertension) - stable, mildly hypertensive, continue home antihypertensives except hold beta blocker for now  All the records are reviewed and case discussed with ED provider. Management plans discussed with the patient and/or family.  DVT PROPHYLAXIS: SubQ lovenox  GI PROPHYLAXIS:  None  ADMISSION STATUS: Inpatient  CODE STATUS: DO NOT RESUSCITATE Code Status History    This patient does not have a recorded code status. Please follow your organizational policy for patients in this situation.      TOTAL TIME TAKING CARE OF THIS PATIENT: 45 minutes.    Carlinda Ohlson FIELDING 06/08/2016, 9:02 PM  [facility]  Office  [phone]  CC: Primary care physician; No PCP Per Patient

## 2016-06-08 NOTE — ED Provider Notes (Addendum)
[facility] Emergency Department Provider Note  ____________________________________________   First MD Initiated Contact with Patient 06/08/16 1819     (approximate)  I have reviewed the triage vital signs and the nursing notes.   HISTORY  Chief Complaint Fall    HPI Patricia Daniels is a 83 y.o. female history of dementia who presents for evaluation of pain in her left ankle after a fall.  She has some caregivers at home during the day and she was found down.  She was not in any distress except for the pain in her left ankle (although the triage note mentions back and knee pain, the patient only reports to me that her ankle hurts).She does not have any injuries to her head and complains of no headache and no neck pain.  Her son is present and reports that she is unsteady on her feet at baseline and should walk with a cane but she frequently does not.  She does not remember the incident but he states that this is normal for her.  She is in no distress at this time and reports no pain unless she tries moving her foot.  She then describes the pain as moderate and moving the foot and light make it worse, rest makes it better.   Past Medical History:  Diagnosis Date  . Dementia   . Hypercholesteremia   . Hypertension   . TIA (transient ischemic attack)     There are no active problems to display for this patient.   Past Surgical History:  Procedure Laterality Date  . ABDOMINAL HYSTERECTOMY      Prior to Admission medications   Medication Sig Start Date End Date Taking? Authorizing Provider  cetirizine (ZYRTEC) 10 MG tablet Take 10 mg by mouth daily.   Yes Historical Provider, MD  donepezil (ARICEPT) 10 MG tablet Take 10 mg by mouth at bedtime.   Yes Historical Provider, MD  lisinopril (PRINIVIL,ZESTRIL) 5 MG tablet Take 5 mg by mouth daily.   Yes Historical Provider, MD  metoprolol succinate (TOPROL-XL) 25 MG 24 hr tablet Take 25 mg by mouth daily.   Yes  Historical Provider, MD  pantoprazole (PROTONIX) 40 MG tablet Take 40 mg by mouth daily. 05/20/16  Yes Historical Provider, MD  topiramate (TOPAMAX) 25 MG tablet Take 25 mg by mouth at bedtime.   Yes Historical Provider, MD    Allergies Codeine  No family history on file.  Social History Social History  Substance Use Topics  . Smoking status: Never Smoker  . Smokeless tobacco: Not on file  . Alcohol use No    Review of Systems Constitutional: No fever/chills Eyes: No visual changes. ENT: No sore throat. Cardiovascular: Denies chest pain. Respiratory: Denies shortness of breath. Gastrointestinal: No abdominal pain.  No nausea, no vomiting.  No diarrhea.  No constipation. Genitourinary: Negative for dysuria. Musculoskeletal: Pain in the left ankle Skin: Negative for rash. Neurological: Negative for headaches, focal weakness or numbness.  10-point ROS otherwise negative.  ____________________________________________   PHYSICAL EXAM:  VITAL SIGNS: ED Triage Vitals  Enc Vitals Group     BP 06/08/16 1645 (!) 148/77     Pulse Rate 06/08/16 1645 63     Resp 06/08/16 1645 16     Temp 06/08/16 1645 97.8 F (36.6 C)     Temp Source 06/08/16 1645 Oral     SpO2 06/08/16 1645 98 %     Weight 06/08/16 1646 123 lb (55.8 kg)  Height 06/08/16 1646 5\' 2"  (1.575 m)     Head Circumference --      Peak Flow --      Pain Score --      Pain Loc --      Pain Edu? --      Excl. in GC? --     Constitutional: Alert, Acute distress, elderly but conversant, interactive, and not complaining of acute pain Eyes: Conjunctivae are injected at baseline according to son. PERRL. EOMI. Head: Atraumatic. Nose: No congestion/rhinnorhea. Mouth/Throat: Mucous membranes are moist. Neck: No stridor.  No meningeal signs.  No cervical spine tenderness to palpation. Cardiovascular: Normal rate, regular rhythm. Good peripheral circulation. Grossly normal heart sounds. Respiratory: Normal respiratory  effort.  No retractions. Lungs CTAB. Gastrointestinal: Soft and nontender. No distention.  Musculoskeletal: The patient has tenderness to palpation, swelling, and ecchymosis of the left ankle with the most notable physical symptoms being on the lateral malleolus.  She has tenderness to palpation of the distal lower leg but no tenderness to palpation of the proximal tibia, knee, no pain or tenderness with flexion and extension of the knee, no tenderness to palpation of the femur.  Her pelvis is stable and nontender to palpation.  She has no acute injuries apparent on exam of the right lower extremity and no upper extremity injuries.  Her affected foot is warm with normal blurry capillary Neurologic:  Normal speech and language. No gross focal neurologic deficits are appreciated.  Skin:  Skin is warm, dry and intact. No rash noted. Psychiatric: Mood and affect are normal. Speech and behavior are normal.  ____________________________________________   LABS (all labs ordered are listed, but only abnormal results are displayed)  Labs Reviewed  BASIC METABOLIC PANEL - Abnormal; Notable for the following:       Result Value   Glucose, Bld 132 (*)    Creatinine, Ser 1.02 (*)    GFR calc non Af Amer 45 (*)    GFR calc Af Amer 52 (*)    All other components within normal limits  CBC - Abnormal; Notable for the following:    WBC 12.4 (*)    All other components within normal limits  URINALYSIS, ROUTINE W REFLEX MICROSCOPIC - Abnormal; Notable for the following:    Color, Urine YELLOW (*)    APPearance HAZY (*)    Hgb urine dipstick SMALL (*)    Squamous Epithelial / LPF 0-5 (*)    All other components within normal limits  TROPONIN I   ____________________________________________  EKG  ED ECG REPORT I, Mickel Schreur, the attending physician, personally viewed and interpreted this ECG.  Date: 06/08/2016 EKG Time: 16:49 Rate: 49 Rhythm: sinus bradycardia QRS Axis: normal Intervals:  normal ST/T Wave abnormalities: Non-specific ST segment / T-wave changes, but no evidence of acute ischemia. Conduction Disturbances: none Narrative Interpretation: unremarkable  ____________________________________________  RADIOLOGY   Dg Ankle Complete Left  Result Date: 06/08/2016 CLINICAL DATA:  Status post fall.  Left ankle swelling EXAM: LEFT ANKLE COMPLETE - 3+ VIEW COMPARISON:  None. FINDINGS: Severe osteopenia. Oblique nondisplaced fracture of the distal fibular metaphysis. No other fracture or dislocation. Intact ankle mortise. Moderate osteoarthritis of the first tarsometatarsal joint. Mild osteoarthritis of the second and third tarsometatarsal joints. Peripheral vascular atherosclerotic disease. IMPRESSION: Acute oblique nondisplaced fracture of the distal fibular metaphysis. Electronically Signed   By: Elige Ko   On: 06/08/2016 18:11   Dg Chest Portable 1 View  Result Date: 06/08/2016 CLINICAL DATA:  Patient  fell at 1 o'clock today. Sudden onset of bradycardia and hypoxemia. EXAM: PORTABLE CHEST 1 VIEW COMPARISON:  02/06/2015 CXR FINDINGS: Aortic atherosclerosis without aneurysm. Top normal-sized cardiac silhouette allowing for the AP projection. Both lungs are clear. Minimal atelectasis noted at the lung bases. No pleural effusion or pulmonary edema. No pneumothorax. Degenerative change of the dorsal spine and both shoulders. IMPRESSION: Aortic atherosclerosis.  Bibasilar atelectasis. Electronically Signed   By: Tollie Ethavid  Kwon M.D.   On: 06/08/2016 20:00    ____________________________________________   PROCEDURES  Procedure(s) performed:   .Splint Application Date/Time: 06/08/2016 8:37 PM Performed by: Loleta RoseFORBACH, Wonder Donaway Authorized by: Loleta RoseFORBACH, Kemontae Dunklee   Consent:    Consent obtained:  Verbal Pre-procedure details:    Sensation:  Normal Procedure details:    Laterality:  Left   Location:  Ankle   Ankle:  L ankle   Strapping: yes     Cast type:  Short leg   Supplies:   Ortho-Glass Post-procedure details:    Pain:  Unchanged   Sensation:  Normal   Patient tolerance of procedure:  Tolerated well, no immediate complications     Critical Care performed: No ____________________________________________   INITIAL IMPRESSION / ASSESSMENT AND PLAN / ED COURSE  Pertinent labs & imaging results that were available during my care of the patient were reviewed by me and considered in my medical decision making (see chart for details).  The patient has a nondisplaced distal fibular fracture.  There is no evidence of any other injury based on my physical exam.  She has persistent bradycardia but it is unclear if this is new or acute.  During one episode in the ED she dropped to 89% spO2, uncertain if this was real.  Awaiting CXR.  Discussed with Ortho who advised non-weightbearing, posterior short leg splint with stirrup.  Discussed with hospitalist given bradycardia, brief hypoxemia, and probable need for placement.  He will admit.     ____________________________________________  FINAL CLINICAL IMPRESSION(S) / ED DIAGNOSES  Final diagnoses:  Fall, initial encounter  Closed nondisplaced oblique fracture of shaft of left fibula, initial encounter  Bradycardia  Hypoxemia     MEDICATIONS GIVEN DURING THIS VISIT:  Medications - No data to display   NEW OUTPATIENT MEDICATIONS STARTED DURING THIS VISIT:  New Prescriptions   No medications on file    Modified Medications   No medications on file    Discontinued Medications   CEPHALEXIN (KEFLEX) 500 MG CAPSULE    Take 1 capsule (500 mg total) by mouth 4 (four) times daily.     Note:  This document was prepared using Dragon voice recognition software and may include unintentional dictation errors.    Loleta Roseory Kayleah Appleyard, MD 06/08/16 [id number]    Loleta Roseory Rashaun Wichert, MD 06/08/16 [id number]    Loleta Roseory Emmerson Taddei, MD 06/08/16 2038

## 2016-06-08 NOTE — Progress Notes (Signed)
Pharmacist - Prescriber Communication  Enoxaparin dose modified to 30 mg subcutaneously once daily due to creatinine clearance less than 30 mL/min.  Patricia Daniels A. [place], Vermont[place]arm.D., BCPS Clinical Pharmacist 06/08/2016 2258

## 2016-06-08 NOTE — ED Triage Notes (Signed)
Fell at home about 1p. Was not witnessed by caregiver. Pain back and knee. Patient has dementia and cannot state what happened but per son she would not normally be able to remember what happened.

## 2016-06-08 NOTE — ED Notes (Signed)
Patient's oxygen saturation continues to drop intermittently. MD made aware and patient placed on 2L [employer] [employer] for supplementation.

## 2016-06-09 ENCOUNTER — Inpatient Hospital Stay (HOSPITAL_COMMUNITY)
Admit: 2016-06-09 | Discharge: 2016-06-09 | Disposition: A | Discharge status: Home / Self Care | Payer: Medicare HMO | Attending: Internal Medicine | Admitting: Internal Medicine

## 2016-06-09 DIAGNOSIS — I5030 Unspecified diastolic (congestive) heart failure: Secondary | ICD-10-CM

## 2016-06-09 LAB — CBC
HCT: 34.9 % — ABNORMAL LOW (ref 35.0–47.0)
Hemoglobin: 12.2 g/dL (ref 12.0–16.0)
MCH: 32.8 pg (ref 26.0–34.0)
MCHC: 35 g/dL (ref 32.0–36.0)
MCV: 93.7 fL (ref 80.0–100.0)
PLATELETS: 293 10*3/uL (ref 150–440)
RBC: 3.73 MIL/uL — ABNORMAL LOW (ref 3.80–5.20)
RDW: 13.8 % (ref 11.5–14.5)
WBC: 9 10*3/uL (ref 3.6–11.0)

## 2016-06-09 LAB — BASIC METABOLIC PANEL
Anion gap: 8 (ref 5–15)
BUN: 19 mg/dL (ref 6–20)
CALCIUM: 8.7 mg/dL — AB (ref 8.9–10.3)
CHLORIDE: 112 mmol/L — AB (ref 101–111)
CO2: 22 mmol/L (ref 22–32)
CREATININE: 1.01 mg/dL — AB (ref 0.44–1.00)
GFR calc non Af Amer: 46 mL/min — ABNORMAL LOW (ref >60)
GFR, EST AFRICAN AMERICAN: 53 mL/min — AB (ref >60)
GLUCOSE: 95 mg/dL (ref 65–99)
Potassium: 3.2 mmol/L — ABNORMAL LOW (ref 3.5–5.1)
Sodium: 142 mmol/L (ref 135–145)

## 2016-06-09 LAB — ECHOCARDIOGRAM COMPLETE
HEIGHTINCHES: 62 in
Weight: 2120 oz

## 2016-06-09 LAB — TROPONIN I: Troponin I: <0.03 ng/mL (ref <0.03)

## 2016-06-09 MED ORDER — POLYVINYL ALCOHOL 1.4 % OP SOLN
1.0000 [drp] | OPHTHALMIC | Status: DC | PRN
  Administered 2016-06-09 – 2016-06-10 (×3): 1 [drp] via OPHTHALMIC
  Filled 2016-06-09 (×2): qty 15

## 2016-06-09 MED ORDER — MORPHINE SULFATE (PF) 4 MG/ML IV SOLN
1.0000 mg | INTRAVENOUS | Status: DC | PRN
  Administered 2016-06-09 (×2): 1 mg via INTRAVENOUS
  Filled 2016-06-09 (×2): qty 1

## 2016-06-09 MED ORDER — OXYCODONE HCL 5 MG PO TABS
10.0000 mg | ORAL_TABLET | ORAL | Status: DC | PRN
  Administered 2016-06-09 – 2016-06-11 (×7): 10 mg via ORAL
  Filled 2016-06-09 (×8): qty 2

## 2016-06-09 NOTE — Progress Notes (Signed)
*  PRELIMINARY RESULTS* Echocardiogram 2D Echocardiogram has been performed.  Cristela BlueHege, Sherol Sabas 06/09/2016, 9:01 AM

## 2016-06-09 NOTE — Consult Note (Signed)
ORTHOPAEDIC CONSULTATION  REQUESTING PHYSICIAN: [name], MD  Chief Complaint:   Left ankle pain.  History of Present Illness: Patricia Daniels is a 83 y.o. female with a history of hypertension, hypercholesterolemia, and dementia who lives independently but has caregivers coming to her home daily. Apparently she was found on the floor yesterday afternoon and brought to the emergency room complaining of left ankle pain. X-rays demonstrated a nondisplaced left distal fibular fracture. She was placed in a posterior splint by the ER provider. However, she was unable to care for herself at home, so she was admitted in anticipation of short-term rehabilitation placement. The patient denies any associated injuries. She did not strike her head or lose consciousness. The patient also denies any lightheadedness, dizziness, chest pain, shortness of breath, or other symptoms may have precipitated her fall. Normally the patient ambulates with a cane as she is very unsteady on her feet, but frequently goes without a cane.  Past Medical History:  Diagnosis Date  . Dementia   . Hypercholesteremia   . Hypertension   . TIA (transient ischemic attack)    Past Surgical History:  Procedure Laterality Date  . ABDOMINAL HYSTERECTOMY     Social History   Social History  . Marital status: Married    Spouse name: N/A  . Number of children: N/A  . Years of education: N/A   Social History Main Topics  . Smoking status: Never Smoker  . Smokeless tobacco: Never Used  . Alcohol use No  . Drug use: No  . Sexual activity: No   Other Topics Concern  . None   Social History Narrative  . None   No family history on file. Allergies  Allergen Reactions  . Codeine Other (See Comments)    Son states it makes her "not feel good"   Prior to Admission medications   Medication Sig Start Date End Date Taking? Authorizing Provider  cetirizine  (ZYRTEC) 10 MG tablet Take 10 mg by mouth daily.   Yes Historical Provider, MD  donepezil (ARICEPT) 10 MG tablet Take 10 mg by mouth at bedtime.   Yes Historical Provider, MD  lisinopril (PRINIVIL,ZESTRIL) 5 MG tablet Take 5 mg by mouth daily.   Yes Historical Provider, MD  metoprolol succinate (TOPROL-XL) 25 MG 24 hr tablet Take 25 mg by mouth daily.   Yes Historical Provider, MD  montelukast (SINGULAIR) 10 MG tablet Take 10 mg by mouth at bedtime. 04/26/16  Yes Historical Provider, MD  pantoprazole (PROTONIX) 40 MG tablet Take 40 mg by mouth daily. 05/20/16  Yes Historical Provider, MD  topiramate (TOPAMAX) 25 MG tablet Take 25 mg by mouth at bedtime.   Yes Historical Provider, MD   Dg Ankle Complete Left  Result Date: 06/08/2016 CLINICAL DATA:  Status post fall.  Left ankle swelling EXAM: LEFT ANKLE COMPLETE - 3+ VIEW COMPARISON:  None. FINDINGS: Severe osteopenia. Oblique nondisplaced fracture of the distal fibular metaphysis. No other fracture or dislocation. Intact ankle mortise. Moderate osteoarthritis of the first tarsometatarsal joint. Mild osteoarthritis of the second and third tarsometatarsal joints. Peripheral vascular atherosclerotic disease. IMPRESSION: Acute oblique nondisplaced fracture of the distal fibular metaphysis. Electronically Signed   By: Elige Ko   On: 06/08/2016 18:11   Dg Chest Portable 1 View  Result Date: 06/08/2016 CLINICAL DATA:  Patient fell at 1 o'clock today. Sudden onset of bradycardia and hypoxemia. EXAM: PORTABLE CHEST 1 VIEW COMPARISON:  02/06/2015 CXR FINDINGS: Aortic atherosclerosis without aneurysm. Top normal-sized cardiac silhouette allowing for  the AP projection. Both lungs are clear. Minimal atelectasis noted at the lung bases. No pleural effusion or pulmonary edema. No pneumothorax. Degenerative change of the dorsal spine and both shoulders. IMPRESSION: Aortic atherosclerosis.  Bibasilar atelectasis. Electronically Signed   By: Tollie Ethavid  Kwon M.D.   On:  06/08/2016 20:00    Positive ROS: All other systems have been reviewed and were otherwise negative with the exception of those mentioned in the HPI and as above.  Physical Exam: General:  Alert, no acute distress Psychiatric:  Patient is appropriately responsive to questions   Cardiovascular:  No pedal edema Respiratory:  No wheezing, non-labored breathing GI:  Abdomen is soft and non-tender Skin:  No lesions in the area of chief complaint Neurologic:  Sensation intact distally Lymphatic:  No axillary or cervical lymphadenopathy  Orthopedic Exam:  Orthopedic examination is limited to the left lower extremity and foot. The patient's leg is in a short-leg posterior splint with a sugar tong supplement. The ankle is in neutral dorsiflexion. The splint appears to be dry and intact. Skin inspection of the proximal and distal margins of the splint is unremarkable. She is able to actively dorsiflex and plantarflex her toes. She has intact sensation to light touch to her toes, and has good capillary refill to all digits.  X-rays:  AP, lateral, and mortise views of the left ankle are available for review. These films demonstrate a nondisplaced oblique fracture of the left distal fibula. The mortise itself is anatomic. No significant degenerative changes are noted. No lytic lesions or other fractures are identified.  Assessment: Nondisplaced left distal fibular fracture.  Plan: The treatment options are discussed with the patient. Based on her x-rays, I feel that this is indeed is a fracture that can be treated nonsurgically with immobilization. She will stay in her splint for the next few days to allow the swelling to go down. She should try to remain nonweightbearing on her left lower extremity. She may be mobilized with physical therapy, utilizing a walker. Most likely, she will require rehabilitation placement for a period of time. I expect that she will need to be nonweightbearing for at least 2  if not 3 weeks before she may begin to weight-bear in a short leg cast or Cam Walker boot.  Thank you for ask me to participate in the care of this most pleasant woman. I will be happy to follow her with you.   Maryagnes AmosJ. Jeffrey Poggi, MD  Beeper #:  [phone]  06/09/2016 2:14 PM

## 2016-06-09 NOTE — Evaluation (Signed)
Physical Therapy Evaluation Patient Details Name: Patricia Daniels MRN: [id number] DOB: 1920-04-07 Today's Date: 06/09/2016   History of Present Illness   83 y.o. female who presents with Fall, L distal Fibula fracture. Here in the ED the patient found to be bradycardic and had a transient episode of hypoxia. Patient has dementia, HTN and TIA.    Clinical Impression  Pt did relatively well with PT exam showing good effort but bed mobility and standing.  She did however have very significant limitations with ability to do any standing mobility.  She was able to to maintain NWBing on L, but struggled to take any hopping steps or use UEs to even shift heel-toe on the R.  Pt with mild confusion but great effort, unsure as to baseline, but she is not safe/able to go home at this time - pt will need short term rehab to return to a functional level.    Follow Up Recommendations SNF    Equipment Recommendations       Recommendations for Other Services       Precautions / Restrictions Precautions Precautions: Fall Restrictions Weight Bearing Restrictions: Yes LLE Weight Bearing: Non weight bearing      Mobility  Bed Mobility Overal bed mobility: Needs Assistance Bed Mobility: Supine to Sit;Sit to Supine     Supine to sit: Min guard;Min assist Sit to supine: Min guard;Min assist   General bed mobility comments: Pt showed great effort getting in/out of bed, needed only light assist and cuing  Transfers Overall transfer level: Needs assistance Equipment used: Rolling walker (2 wheeled) Transfers: Sit to/from Stand Sit to Stand: Min guard;Min assist         General transfer comment: Pt short so nearly into standing getting to EOB but ultimately she did better than expected keeping L LE off the floor and rising to standing with only limited assist and cuing  Ambulation/Gait             General Gait Details: unable to do any real ambulation, she was able to scoot X2 to the side  with considerable assist and cuing from PT, but pt struggled with unweighting with UEs to be able to hop R and maintain L NWBing  Stairs            Wheelchair Mobility    Modified Rankin (Stroke Patients Only)       Balance Overall balance assessment: Needs assistance Sitting-balance support: No upper extremity supported Sitting balance-Leahy Scale: Good     Standing balance support: Bilateral upper extremity supported Standing balance-Leahy Scale: Poor Standing balance comment: difficutly with standing secondary to L NWBing                             Pertinent Vitals/Pain Pain Assessment:  (general pain, unable to rate - did not seem severe)    Home Living Family/patient expects to be discharged to:: Skilled nursing facility Living Arrangements:  (pt reports she lives with husband, per note children)                    Prior Function           Comments: Pt indicated that she is able to be relatively active, unsure     Hand Dominance        Extremity/Trunk Assessment   Upper Extremity Assessment Upper Extremity Assessment: Generalized weakness (age appropriate deficits)    Lower Extremity Assessment Lower Extremity  Assessment: LLE deficits/detail;Generalized weakness (R LE grossly 3+ to 4-/5) LLE Deficits / Details: ankle splint donned, had some toe flx/ext, able to do SLRs with effort, generally weak but functional given the fib fx       Communication   Communication: No difficulties (unsure about pt's ability to appropriately report accurately)  Cognition Arousal/Alertness: Awake/alert Behavior During Therapy:  (pt willing to participate and follow instructions well) Overall Cognitive Status: Difficult to assess                 General Comments: Pt with some mild confusion, but generally was able to follow along with PT the entire time    General Comments      Exercises     Assessment/Plan    PT Assessment  Patient needs continued PT services  PT Problem List Decreased strength;Decreased range of motion;Decreased activity tolerance;Decreased balance;Decreased mobility;Decreased cognition;Decreased knowledge of use of DME;Decreased safety awareness;Pain       PT Treatment Interventions DME instruction;Gait training;Stair training;Functional mobility training;Therapeutic activities;Therapeutic exercise;Balance training;Cognitive remediation;Patient/family education    PT Goals (Current goals can be found in the Care Plan section)  Acute Rehab PT Goals Patient Stated Goal: walk PT Goal Formulation: With patient Time For Goal Achievement: 06/23/16 Potential to Achieve Goals: Fair    Frequency Min 2X/week   Barriers to discharge        Co-evaluation               End of Session Equipment Utilized During Treatment: Gait belt Activity Tolerance: Patient limited by fatigue;Patient tolerated treatment well Patient left: with bed alarm set;with call bell/phone within reach Nurse Communication: Mobility status PT Visit Diagnosis: Muscle weakness (generalized) (M62.81);Difficulty in walking, not elsewhere classified (R26.2)         Time: 1610-9604 PT Time Calculation (min) (ACUTE ONLY): 28 min   Charges:   PT Evaluation $PT Eval Low Complexity: 1 Procedure     PT G Codes:         Malachi Pro, DPT 06/09/2016, 10:44 AM

## 2016-06-09 NOTE — Clinical Social Work Note (Addendum)
CSW spoke to patient to discuss SNF placement and process for looking for bed for short term rehab.  CSW explained to patient how insurance will pay for her stay and what to expect at SNF.  Patient states she has not been to rehab before.  CSW explained what facilities are available for short term rehab, CSW as also given permission to contact her son Fayrene FearingJames to discuss SNF placement.  CSW attempted to call patient's son, and there was no answer and no voice mail.  Patient gave CSW permission to [place], patient was informed with her insurance it may take a couple of days for authorization.  Formal assessment to follow.  Ervin KnackEric R. Hassan Rowannterhaus, MSW, Theresia MajorsLCSWA [phone]  06/09/2016 4:41 PM

## 2016-06-09 NOTE — Care Management Important Message (Signed)
Important Message  Patient Details  Name: Trula Sladenna T Noon MRN: [id number] Date of Birth: 1919/07/29   Medicare Important Message Given:  Yes  Initial signed IM printed from Epic and given to patient.     Eber HongGreene, Kambri Dismore R, RN 06/09/2016, 12:04 PM

## 2016-06-09 NOTE — [place] FL2 (Signed)
[place] MEDICAID FL2 LEVEL OF CARE SCREENING TOOL     IDENTIFICATION  Patient Name: Patricia Daniels Birthdate: 07-Sep-1919 Sex: female Admission Date (Current Location): 06/08/2016  [place]   Facility and Address:  [facility], [address], [place], [place] [address]      Provider Number: [id number]  Attending Physician Name and Address:  [name], MD  Relative Name and Phone Number:  [name],[name] [phone]     Current Level of Care: Hospital Recommended Level of Care: Skilled Nursing Facility Prior Approval Number:    Date Approved/Denied:   PASRR Number: [id number]  Discharge Plan: SNF    Current Diagnoses: Patient Active Problem List   Diagnosis Date Noted  . Bradycardia 06/08/2016  . Fall 06/08/2016  . Closed fibular fracture 06/08/2016  . HTN (hypertension) 06/08/2016  . HLD (hyperlipidemia) 06/08/2016    Orientation RESPIRATION BLADDER Height & Weight     Self, Time, Situation, Place  O2 (2L) Incontinent Weight: 132 lb 8 oz (60.1 kg) Height:  5\' 2"  (157.5 cm)  BEHAVIORAL SYMPTOMS/MOOD NEUROLOGICAL BOWEL NUTRITION STATUS      Continent Diet (Regular)  AMBULATORY STATUS COMMUNICATION OF NEEDS Skin   Limited Assist Verbally Normal                       Personal Care Assistance Level of Assistance  Bathing, Feeding, Dressing Bathing Assistance: Limited assistance Feeding assistance: Independent Dressing Assistance: Limited assistance     Functional Limitations Info  Sight, Hearing, Speech Sight Info: Adequate Hearing Info: Adequate Speech Info: Adequate    SPECIAL CARE FACTORS FREQUENCY  PT (By licensed PT), OT (By licensed OT)     PT Frequency: 5x a week OT Frequency: 5x a week            Contractures Contractures Info: Not present    Additional Factors Info  Code Status, Allergies Code Status Info: DNR Allergies Info: Codeine           Current  Medications (06/09/2016):  This is the current hospital active medication list Current Facility-Administered Medications  Medication Dose Route Frequency Provider Last Rate Last Dose  . acetaminophen (TYLENOL) tablet 650 mg  650 mg Oral Q6H PRN [name], MD   650 mg at 06/08/16 2325   Or  . acetaminophen (TYLENOL) suppository 650 mg  650 mg Rectal Q6H PRN [name], MD      . donepezil (ARICEPT) tablet 10 mg  10 mg Oral QHS [name], MD   10 mg at 06/08/16 2326  . enoxaparin (LOVENOX) injection 30 mg  30 mg Subcutaneous QHS [name], MD   30 mg at 06/08/16 2326  . hydrALAZINE (APRESOLINE) injection 5 mg  5 mg Intravenous Q4H PRN [name], MD   5 mg at 06/08/16 2139  . Influenza vac split quadrivalent PF (FLUARIX) injection 0.5 mL  0.5 mL Intramuscular Tomorrow-1000 [name], MD      . lisinopril (PRINIVIL,ZESTRIL) tablet 5 mg  5 mg Oral Daily [name], MD   5 mg at 06/08/16 2325  . montelukast (SINGULAIR) tablet 10 mg  10 mg Oral QHS [name], MD   10 mg at 06/08/16 2326  . morphine 4 MG/ML injection 1 mg  1 mg Intravenous Q4H PRN [name], MD   1 mg at 06/09/16 1320  . ondansetron (ZOFRAN) tablet 4 mg  4 mg Oral Q6H PRN [name], MD  Or  . ondansetron (ZOFRAN) injection 4 mg  4 mg Intravenous Q6H PRN [name], MD      . oxyCODONE (Oxy IR/ROXICODONE) immediate release tablet 10 mg  10 mg Oral Q4H PRN [name], MD   10 mg at 06/09/16 1136  . pantoprazole (PROTONIX) EC tablet 40 mg  40 mg Oral QAC breakfast [name], MD   40 mg at 06/08/16 2326  . sodium chloride flush (NS) 0.9 % injection 3 mL  3 mL Intravenous Q12H [name], MD   3 mL at 06/08/16 2326  . topiramate (TOPAMAX) tablet 25 mg  25 mg Oral QHS [name], MD   25 mg at 06/08/16 2334     Discharge Medications: Please see discharge summary for a list of discharge medications.  Relevant Imaging Results:  Relevant Lab Results:   Additional Information SSN  [id number]  [name], [place]

## 2016-06-09 NOTE — Progress Notes (Signed)
[facility] [place] [facility]                                                                                                                                                                                  Patient Demographics   Patricia Daniels, is a 83 y.o. female, DOB - [birth year], JXB:[id number]  Admit date - 06/08/2016   Admitting Physician [name], MD  Outpatient Primary MD for the patient is No PCP Per Patient   LOS - 1  Subjective: Patient is a pain in the left  leg Her heart rate is stable after holding the beta blocker    Review of Systems:   CONSTITUTIONAL: No documented fever. No fatigue, weakness. No weight gain, no weight loss.  EYES: No blurry or double vision.  ENT: No tinnitus. No postnasal drip. No redness of the oropharynx.  RESPIRATORY: No cough, no wheeze, no hemoptysis. No dyspnea.  CARDIOVASCULAR: No chest pain. No orthopnea. No palpitations. No syncope.  GASTROINTESTINAL: No nausea, no vomiting or diarrhea. No abdominal pain. No melena or hematochezia.  GENITOURINARY: No dysuria or hematuria.  ENDOCRINE: No polyuria or nocturia. No heat or cold intolerance.  HEMATOLOGY: No anemia. No bruising. No bleeding.  INTEGUMENTARY: No rashes. No lesions.  MUSCULOSKELETAL: No arthritis. No swelling. No gout. Left leg pain NEUROLOGIC: No numbness, tingling, or ataxia. No seizure-type activity.  PSYCHIATRIC: No anxiety. No insomnia. No ADD.    Vitals:   Vitals:   06/08/16 2210 06/08/16 2246 06/09/16 0437 06/09/16 1223  BP:  (!) 177/57 (!) 145/56 (!) 158/44  Pulse: (!) 59 70 (!) 59 (!) 50  Resp: 18 (!) 24 18 18   Temp:  98.1 F (36.7 C) 98.1 F (36.7 C) 97.7 F (36.5 C)  TempSrc:  Oral  Oral  SpO2: 100% 100% 95% 97%  Weight:  132 lb 8 oz (60.1 kg)    Height:        Wt Readings from Last 3 Encounters:  06/08/16 132 lb 8 oz (60.1 kg)  02/06/15 123 lb (55.8 kg)     Intake/Output Summary (Last 24 hours) at 06/09/16 1501 Last data filed  at 06/09/16 0500  Gross per 24 hour  Intake           236.33 ml  Output                0 ml  Net           236.33 ml    Physical Exam:   GENERAL: Pleasant-appearing in no apparent distress.  HEAD, EYES, EARS, NOSE AND THROAT: Atraumatic, normocephalic. Extraocular muscles are intact. Pupils equal and reactive to light. Sclerae anicteric. No  conjunctival injection. No oro-pharyngeal erythema.  NECK: Supple. There is no jugular venous distention. No bruits, no lymphadenopathy, no thyromegaly.  HEART: Regular rate and rhythm,. No murmurs, no rubs, no clicks.  LUNGS: Clear to auscultation bilaterally. No rales or rhonchi. No wheezes.  ABDOMEN: Soft, flat, nontender, nondistended. Has good bowel sounds. No hepatosplenomegaly appreciated.  EXTREMITIES: No evidence of any cyanosis, clubbing, or peripheral edema.  +2 pedal and radial pulses bilaterally.  NEUROLOGIC: The patient is alert, awake, and oriented x3 with no focal motor or sensory deficits appreciated bilaterally.  SKIN: Moist and warm with no rashes appreciated.  Psych: Not anxious, depressed LN: No inguinal LN enlargement    Antibiotics   Anti-infectives    None      Medications   Scheduled Meds: . donepezil  10 mg Oral QHS  . enoxaparin (LOVENOX) injection  30 mg Subcutaneous QHS  . Influenza vac split quadrivalent PF  0.5 mL Intramuscular Tomorrow-1000  . lisinopril  5 mg Oral Daily  . montelukast  10 mg Oral QHS  . pantoprazole  40 mg Oral QAC breakfast  . sodium chloride flush  3 mL Intravenous Q12H  . topiramate  25 mg Oral QHS   Continuous Infusions: PRN Meds:.acetaminophen **OR** acetaminophen, hydrALAZINE, morphine injection, ondansetron **OR** ondansetron (ZOFRAN) IV, oxyCODONE   Data Review:   Micro Results No results found for this or any previous visit (from the past 240 hour(s)).  Radiology Reports Dg Ankle Complete Left  Result Date: 06/08/2016 CLINICAL DATA:  Status post fall.  Left ankle  swelling EXAM: LEFT ANKLE COMPLETE - 3+ VIEW COMPARISON:  None. FINDINGS: Severe osteopenia. Oblique nondisplaced fracture of the distal fibular metaphysis. No other fracture or dislocation. Intact ankle mortise. Moderate osteoarthritis of the first tarsometatarsal joint. Mild osteoarthritis of the second and third tarsometatarsal joints. Peripheral vascular atherosclerotic disease. IMPRESSION: Acute oblique nondisplaced fracture of the distal fibular metaphysis. Electronically Signed   By: Elige Ko   On: 06/08/2016 18:11   Dg Chest Portable 1 View  Result Date: 06/08/2016 CLINICAL DATA:  Patient fell at 1 o'clock today. Sudden onset of bradycardia and hypoxemia. EXAM: PORTABLE CHEST 1 VIEW COMPARISON:  02/06/2015 CXR FINDINGS: Aortic atherosclerosis without aneurysm. Top normal-sized cardiac silhouette allowing for the AP projection. Both lungs are clear. Minimal atelectasis noted at the lung bases. No pleural effusion or pulmonary edema. No pneumothorax. Degenerative change of the dorsal spine and both shoulders. IMPRESSION: Aortic atherosclerosis.  Bibasilar atelectasis. Electronically Signed   By: Tollie Eth M.D.   On: 06/08/2016 20:00     CBC  Recent Labs Lab 06/08/16 1652 06/09/16 0457  WBC 12.4* 9.0  HGB 13.1 12.2  HCT 37.9 34.9*  PLT 323 293  MCV 93.5 93.7  MCH 32.3 32.8  MCHC 34.6 35.0  RDW 13.4 13.8    Chemistries   Recent Labs Lab 06/08/16 1652 06/09/16 0457  NA 142 142  K 3.7 3.2*  CL 108 112*  CO2 25 22  GLUCOSE 132* 95  BUN 19 19  CREATININE 1.02* 1.01*  CALCIUM 9.0 8.7*   ------------------------------------------------------------------------------------------------------------------ estimated creatinine clearance is 26.4 mL/min (by C-G formula based on SCr of 1.01 mg/dL (H)). ------------------------------------------------------------------------------------------------------------------ No results for input(s): HGBA1C in the last 72  hours. ------------------------------------------------------------------------------------------------------------------ No results for input(s): CHOL, HDL, LDLCALC, TRIG, CHOLHDL, LDLDIRECT in the last 72 hours. ------------------------------------------------------------------------------------------------------------------ No results for input(s): TSH, T4TOTAL, T3FREE, THYROIDAB in the last 72 hours.  Invalid input(s): FREET3 ------------------------------------------------------------------------------------------------------------------ No results for input(s): VITAMINB12, FOLATE, FERRITIN, TIBC,  IRON, RETICCTPCT in the last 72 hours.  Coagulation profile No results for input(s): INR, PROTIME in the last 168 hours.  No results for input(s): DDIMER in the last 72 hours.  Cardiac Enzymes  Recent Labs Lab 06/08/16 2304 06/09/16 0457 06/09/16 1052  TROPONINI <0.03 <0.03 <0.03   ------------------------------------------------------------------------------------------------------------------ Invalid input(s): POCBNP    Assessment & Plan  Patient is 83 year old with a mechanical fall 1, Bradycardia - heart rate stable with holding the metoprolol Continue to monitor 2.   Fall - likely mechanical in nature echo pending  3.   Closed fibular fracture - seen by ortho needs nonsurgical therapy   4.   HTN (hypertension) - stable, continue lisinopril  5. Miscellaneous Lovenox for DVT prophylaxis     Code Status Orders        Start     Ordered   06/08/16 2253  Do not attempt resuscitation (DNR)  Continuous    Question Answer Comment  In the event of cardiac or respiratory ARREST Do not call a "code blue"   In the event of cardiac or respiratory ARREST Do not perform Intubation, CPR, defibrillation or ACLS   In the event of cardiac or respiratory ARREST Use medication by any route, position, wound care, and other measures to relive pain and suffering. May use oxygen, suction  and manual treatment of airway obstruction as needed for comfort.      06/08/16 2252    Code Status History    Date Active Date Inactive Code Status Order ID Comments User Context   This patient has a current code status but no historical code status.    Advance Directive Documentation   Flowsheet Row Most Recent Value  Type of Advance Directive  Out of facility DNR (pink MOST or yellow form)  Pre-existing out of facility DNR order (yellow form or pink MOST form)  No data  "MOST" Form in Place?  No data           Consults Ortho   DVT Prophylaxis  Lovenox  Lab Results  Component Value Date   PLT 293 06/09/2016     Time Spent in minutes    Greater than 50% of time spent in care coordination and counseling patient regarding the condition and plan of care.   [name] M.D on 06/09/2016 at 3:01 PM  Between 7am to 6pm - Pager - [phone]  After 6pm go to [email or web address] - password [email or web address] [facility]  [facility]   Office  [phone]

## 2016-06-09 NOTE — Plan of Care (Signed)
Problem: Pain Managment: Goal: General experience of comfort will improve Outcome: Not Progressing Pt with complaints of foot pain due to fibula fracture, pt treated with tylenol and had relief.  Problem: Skin Integrity: Goal: Risk for impaired skin integrity will decrease Outcome: Progressing Pink sacral foam placed on pt's bottom

## 2016-06-10 LAB — BASIC METABOLIC PANEL
ANION GAP: 7 (ref 5–15)
BUN: 16 mg/dL (ref 6–20)
CALCIUM: 8.7 mg/dL — AB (ref 8.9–10.3)
CO2: 23 mmol/L (ref 22–32)
Chloride: 112 mmol/L — ABNORMAL HIGH (ref 101–111)
Creatinine, Ser: 0.81 mg/dL (ref 0.44–1.00)
GFR calc non Af Amer: 60 mL/min — ABNORMAL LOW (ref >60)
Glucose, Bld: 97 mg/dL (ref 65–99)
Potassium: 3.4 mmol/L — ABNORMAL LOW (ref 3.5–5.1)
Sodium: 142 mmol/L (ref 135–145)

## 2016-06-10 MED ORDER — POTASSIUM CHLORIDE CRYS ER 20 MEQ PO TBCR
20.0000 meq | EXTENDED_RELEASE_TABLET | Freq: Once | ORAL | Status: AC
  Administered 2016-06-10: 20 meq via ORAL
  Filled 2016-06-10: qty 1

## 2016-06-10 MED ORDER — LISINOPRIL 10 MG PO TABS
10.0000 mg | ORAL_TABLET | Freq: Every day | ORAL | Status: DC
  Administered 2016-06-10: 10 mg via ORAL
  Filled 2016-06-10: qty 1

## 2016-06-10 MED ORDER — ENOXAPARIN SODIUM 40 MG/0.4ML ~~LOC~~ SOLN
40.0000 mg | Freq: Every day | SUBCUTANEOUS | Status: DC
  Administered 2016-06-10: 40 mg via SUBCUTANEOUS
  Filled 2016-06-10: qty 0.4

## 2016-06-10 NOTE — Progress Notes (Signed)
[facility] - [place] at [facility]                                                                                                                                                                                  Patient Demographics   Patricia Daniels, is a 83 y.o. female, DOB - [birth year], ZOX:[id number]  Admit date - 06/08/2016   Admitting Physician [name], MD  Outpatient Primary MD for the patient is No PCP Per Patient   LOS - 2  Subjective: Still has some pain in the leg with no other complaints    Review of Systems:   CONSTITUTIONAL: No documented fever. No fatigue, weakness. No weight gain, no weight loss.  EYES: No blurry or double vision.  ENT: No tinnitus. No postnasal drip. No redness of the oropharynx.  RESPIRATORY: No cough, no wheeze, no hemoptysis. No dyspnea.  CARDIOVASCULAR: No chest pain. No orthopnea. No palpitations. No syncope.  GASTROINTESTINAL: No nausea, no vomiting or diarrhea. No abdominal pain. No melena or hematochezia.  GENITOURINARY: No dysuria or hematuria.  ENDOCRINE: No polyuria or nocturia. No heat or cold intolerance.  HEMATOLOGY: No anemia. No bruising. No bleeding.  INTEGUMENTARY: No rashes. No lesions.  MUSCULOSKELETAL: No arthritis. No swelling. No gout. Left leg pain NEUROLOGIC: No numbness, tingling, or ataxia. No seizure-type activity.  PSYCHIATRIC: No anxiety. No insomnia. No ADD.    Vitals:   Vitals:   06/09/16 2212 06/09/16 2352 06/10/16 0842 06/10/16 1127  BP: (!) 183/65 (!) 173/52 (!) 186/48 (!) 149/47  Pulse: 64 (!) 55 (!) 56 (!) 58  Resp:      Temp:   97.7 F (36.5 C) 97.6 F (36.4 C)  TempSrc:   Oral Oral  SpO2: 96%  95% 96%  Weight:      Height:        Wt Readings from Last 3 Encounters:  06/08/16 132 lb 8 oz (60.1 kg)  02/06/15 123 lb (55.8 kg)     Intake/Output Summary (Last 24 hours) at 06/10/16 1252 Last data filed at 06/10/16 0853  Gross per 24 hour  Intake              243 ml  Output                 0 ml  Net              243 ml    Physical Exam:   GENERAL: Pleasant-appearing in no apparent distress.  HEAD, EYES, EARS, NOSE AND THROAT: Atraumatic, normocephalic. Extraocular muscles are intact. Pupils equal and reactive to light. Sclerae anicteric. No conjunctival injection. No oro-pharyngeal erythema.  NECK: Supple. There is  no jugular venous distention. No bruits, no lymphadenopathy, no thyromegaly.  HEART: Regular rate and rhythm,. No murmurs, no rubs, no clicks.  LUNGS: Clear to auscultation bilaterally. No rales or rhonchi. No wheezes.  ABDOMEN: Soft, flat, nontender, nondistended. Has good bowel sounds. No hepatosplenomegaly appreciated.  EXTREMITIES: No evidence of any cyanosis, clubbing, or peripheral edema.  +2 pedal and radial pulses bilaterally.  NEUROLOGIC: The patient is alert, awake, and oriented x3 with no focal motor or sensory deficits appreciated bilaterally.  SKIN: Moist and warm with no rashes appreciated.  Psych: Not anxious, depressed LN: No inguinal LN enlargement    Antibiotics   Anti-infectives    None      Medications   Scheduled Meds: . donepezil  10 mg Oral QHS  . enoxaparin (LOVENOX) injection  40 mg Subcutaneous QHS  . Influenza vac split quadrivalent PF  0.5 mL Intramuscular Tomorrow-1000  . lisinopril  10 mg Oral Daily  . montelukast  10 mg Oral QHS  . pantoprazole  40 mg Oral QAC breakfast  . sodium chloride flush  3 mL Intravenous Q12H  . topiramate  25 mg Oral QHS   Continuous Infusions: PRN Meds:.acetaminophen **OR** acetaminophen, hydrALAZINE, morphine injection, ondansetron **OR** ondansetron (ZOFRAN) IV, oxyCODONE, polyvinyl alcohol   Data Review:   Micro Results No results found for this or any previous visit (from the past 240 hour(s)).  Radiology Reports Dg Ankle Complete Left  Result Date: 06/08/2016 CLINICAL DATA:  Status post fall.  Left ankle swelling EXAM: LEFT ANKLE COMPLETE - 3+ VIEW COMPARISON:  None.  FINDINGS: Severe osteopenia. Oblique nondisplaced fracture of the distal fibular metaphysis. No other fracture or dislocation. Intact ankle mortise. Moderate osteoarthritis of the first tarsometatarsal joint. Mild osteoarthritis of the second and third tarsometatarsal joints. Peripheral vascular atherosclerotic disease. IMPRESSION: Acute oblique nondisplaced fracture of the distal fibular metaphysis. Electronically Signed   By: Elige Ko   On: 06/08/2016 18:11   Dg Chest Portable 1 View  Result Date: 06/08/2016 CLINICAL DATA:  Patient fell at 1 o'clock today. Sudden onset of bradycardia and hypoxemia. EXAM: PORTABLE CHEST 1 VIEW COMPARISON:  02/06/2015 CXR FINDINGS: Aortic atherosclerosis without aneurysm. Top normal-sized cardiac silhouette allowing for the AP projection. Both lungs are clear. Minimal atelectasis noted at the lung bases. No pleural effusion or pulmonary edema. No pneumothorax. Degenerative change of the dorsal spine and both shoulders. IMPRESSION: Aortic atherosclerosis.  Bibasilar atelectasis. Electronically Signed   By: Tollie Eth M.D.   On: 06/08/2016 20:00     CBC  Recent Labs Lab 06/08/16 1652 06/09/16 0457  WBC 12.4* 9.0  HGB 13.1 12.2  HCT 37.9 34.9*  PLT 323 293  MCV 93.5 93.7  MCH 32.3 32.8  MCHC 34.6 35.0  RDW 13.4 13.8    Chemistries   Recent Labs Lab 06/08/16 1652 06/09/16 0457 06/10/16 0411  NA 142 142 142  K 3.7 3.2* 3.4*  CL 108 112* 112*  CO2 25 22 23   GLUCOSE 132* 95 97  BUN 19 19 16   CREATININE 1.02* 1.01* 0.81  CALCIUM 9.0 8.7* 8.7*   ------------------------------------------------------------------------------------------------------------------ estimated creatinine clearance is 32.9 mL/min (by C-G formula based on SCr of 0.81 mg/dL). ------------------------------------------------------------------------------------------------------------------ No results for input(s): HGBA1C in the last 72  hours. ------------------------------------------------------------------------------------------------------------------ No results for input(s): CHOL, HDL, LDLCALC, TRIG, CHOLHDL, LDLDIRECT in the last 72 hours. ------------------------------------------------------------------------------------------------------------------ No results for input(s): TSH, T4TOTAL, T3FREE, THYROIDAB in the last 72 hours.  Invalid input(s): FREET3 ------------------------------------------------------------------------------------------------------------------ No results for input(s): VITAMINB12, FOLATE, FERRITIN,  TIBC, IRON, RETICCTPCT in the last 72 hours.  Coagulation profile No results for input(s): INR, PROTIME in the last 168 hours.  No results for input(s): DDIMER in the last 72 hours.  Cardiac Enzymes  Recent Labs Lab 06/08/16 2304 06/09/16 0457 06/09/16 1052  TROPONINI <0.03 <0.03 <0.03   ------------------------------------------------------------------------------------------------------------------ Invalid input(s): POCBNP    Assessment & Plan  Patient is 83 year old with a mechanical fall  1, Bradycardia - heart rate stable with holding the metoprolol Continue to monitor 2.   Fall - likely mechanical in nature echo Results reviewed   3.   Closed fibular fracture - seen by ortho needs nonsurgical therapy follow-up with orthopedics as outpatient  4.   HTN (hypertension) - stable, continue lisinopril  5. Miscellaneous Lovenox for DVT prophylaxis     Code Status Orders        Start     Ordered   06/08/16 2253  Do not attempt resuscitation (DNR)  Continuous    Question Answer Comment  In the event of cardiac or respiratory ARREST Do not call a "code blue"   In the event of cardiac or respiratory ARREST Do not perform Intubation, CPR, defibrillation or ACLS   In the event of cardiac or respiratory ARREST Use medication by any route, position, wound care, and other measures  to relive pain and suffering. May use oxygen, suction and manual treatment of airway obstruction as needed for comfort.      06/08/16 2252    Code Status History    Date Active Date Inactive Code Status Order ID Comments User Context   This patient has a current code status but no historical code status.    Advance Directive Documentation   Flowsheet Row Most Recent Value  Type of Advance Directive  Out of facility DNR (pink MOST or yellow form)  Pre-existing out of facility DNR order (yellow form or pink MOST form)  No data  "MOST" Form in Place?  No data           Consults Ortho   DVT Prophylaxis  Lovenox  Lab Results  Component Value Date   PLT 293 06/09/2016     Time Spent in minutes    Greater than 50% of time spent in care coordination and counseling patient regarding the condition and plan of care.   [name] M.D on 06/10/2016 at 12:52 PM  Between 7am to 6pm - Pager - [phone]  After 6pm go to [email or web address] - password [email or web address] [facility]  [facility] [place] [facility]   Office  [phone]

## 2016-06-10 NOTE — Plan of Care (Signed)
Problem: Safety: Goal: Ability to remain free from injury will improve Outcome: Progressing Pt is bedrest, and NWB on the left leg, pt has remained free from falls/injury. Bed alarm on

## 2016-06-10 NOTE — Progress Notes (Signed)
Pharmacist - Prescriber Communication  Enoxaparin dose modified to 40 mg subcutaneously once daily due to creatinine clearance now greater than 30 mL/min.  Bari MantisKristin Pranika Finks PharmD Clinical Pharmacist 06/10/2016

## 2016-06-10 NOTE — Consult Note (Signed)
Reason for Consult: Bradycardia and falls preop for leg fracture Referring Physician: Dr. Posey Pronto hospitalist  Patricia Daniels is an 83 y.o. female.  HPI: Patient states that reportedly has dementia was found to be bradycardiac transient episodes of hypoxemia recently fell fraction of foot. Patient has been unsteady on his feet with Cytoxan heart for and may have slipped and fell injuring her lower leg producing a fracture. Patient has a closed fracture was reduced and now is in a splint. Patient again had significant bradycardia but denies any chest pain or palpitations or tachycardia  Past Medical History:  Diagnosis Date  . Dementia   . Hypercholesteremia   . Hypertension   . TIA (transient ischemic attack)     Past Surgical History:  Procedure Laterality Date  . ABDOMINAL HYSTERECTOMY      No family history on file.  Social History:  reports that she has never smoked. She has never used smokeless tobacco. She reports that she does not drink alcohol or use drugs.  Allergies:  Allergies  Allergen Reactions  . Codeine Other (See Comments)    Son states it makes her "not feel good"    Medications: I have reviewed the patient's current medications.  Results for orders placed or performed during the hospital encounter of 06/08/16 (from the past 48 hour(s))  Basic metabolic panel     Status: Abnormal   Collection Time: 06/08/16  4:52 PM  Result Value Ref Range   Sodium 142 135 - 145 mmol/L   Potassium 3.7 3.5 - 5.1 mmol/L   Chloride 108 101 - 111 mmol/L   CO2 25 22 - 32 mmol/L   Glucose, Bld 132 (H) 65 - 99 mg/dL   BUN 19 6 - 20 mg/dL   Creatinine, Ser 1.02 (H) 0.44 - 1.00 mg/dL   Calcium 9.0 8.9 - 10.3 mg/dL   GFR calc non Af Amer 45 (L) >60 mL/min   GFR calc Af Amer 52 (L) >60 mL/min    Comment: (NOTE) The eGFR has been calculated using the CKD EPI equation. This calculation has not been validated in all clinical situations. eGFR's persistently <60 mL/min signify possible  Chronic Kidney Disease.    Anion gap 9 5 - 15  CBC     Status: Abnormal   Collection Time: 06/08/16  4:52 PM  Result Value Ref Range   WBC 12.4 (H) 3.6 - 11.0 K/uL   RBC 4.06 3.80 - 5.20 MIL/uL   Hemoglobin 13.1 12.0 - 16.0 g/dL   HCT 37.9 35.0 - 47.0 %   MCV 93.5 80.0 - 100.0 fL   MCH 32.3 26.0 - 34.0 pg   MCHC 34.6 32.0 - 36.0 g/dL   RDW 13.4 11.5 - 14.5 %   Platelets 323 150 - 440 K/uL  Troponin I     Status: None   Collection Time: 06/08/16  4:52 PM  Result Value Ref Range   Troponin I <0.03 <0.03 ng/mL  Urinalysis, Routine w reflex microscopic     Status: Abnormal   Collection Time: 06/08/16  6:52 PM  Result Value Ref Range   Color, Urine YELLOW (A) YELLOW   APPearance HAZY (A) CLEAR   Specific Gravity, Urine 1.019 1.005 - 1.030   pH 5.0 5.0 - 8.0   Glucose, UA NEGATIVE NEGATIVE mg/dL   Hgb urine dipstick SMALL (A) NEGATIVE   Bilirubin Urine NEGATIVE NEGATIVE   Ketones, ur NEGATIVE NEGATIVE mg/dL   Protein, ur NEGATIVE NEGATIVE mg/dL   Nitrite NEGATIVE NEGATIVE  Leukocytes, UA NEGATIVE NEGATIVE   RBC / HPF 0-5 0 - 5 RBC/hpf   WBC, UA 0-5 0 - 5 WBC/hpf   Bacteria, UA NONE SEEN NONE SEEN   Squamous Epithelial / LPF 0-5 (A) NONE SEEN   Mucous PRESENT    Hyaline Casts, UA PRESENT   Troponin I     Status: None   Collection Time: 06/08/16 11:04 PM  Result Value Ref Range   Troponin I <0.03 <0.03 ng/mL  Basic metabolic panel     Status: Abnormal   Collection Time: 06/09/16  4:57 AM  Result Value Ref Range   Sodium 142 135 - 145 mmol/L   Potassium 3.2 (L) 3.5 - 5.1 mmol/L   Chloride 112 (H) 101 - 111 mmol/L   CO2 22 22 - 32 mmol/L   Glucose, Bld 95 65 - 99 mg/dL   BUN 19 6 - 20 mg/dL   Creatinine, Ser 1.01 (H) 0.44 - 1.00 mg/dL   Calcium 8.7 (L) 8.9 - 10.3 mg/dL   GFR calc non Af Amer 46 (L) >60 mL/min   GFR calc Af Amer 53 (L) >60 mL/min    Comment: (NOTE) The eGFR has been calculated using the CKD EPI equation. This calculation has not been validated in all  clinical situations. eGFR's persistently <60 mL/min signify possible Chronic Kidney Disease.    Anion gap 8 5 - 15  CBC     Status: Abnormal   Collection Time: 06/09/16  4:57 AM  Result Value Ref Range   WBC 9.0 3.6 - 11.0 K/uL   RBC 3.73 (L) 3.80 - 5.20 MIL/uL   Hemoglobin 12.2 12.0 - 16.0 g/dL   HCT 34.9 (L) 35.0 - 47.0 %   MCV 93.7 80.0 - 100.0 fL   MCH 32.8 26.0 - 34.0 pg   MCHC 35.0 32.0 - 36.0 g/dL   RDW 13.8 11.5 - 14.5 %   Platelets 293 150 - 440 K/uL  Troponin I     Status: None   Collection Time: 06/09/16  4:57 AM  Result Value Ref Range   Troponin I <0.03 <0.03 ng/mL  Troponin I     Status: None   Collection Time: 06/09/16 10:52 AM  Result Value Ref Range   Troponin I <0.03 <0.03 ng/mL  Basic metabolic panel     Status: Abnormal   Collection Time: 06/10/16  4:11 AM  Result Value Ref Range   Sodium 142 135 - 145 mmol/L   Potassium 3.4 (L) 3.5 - 5.1 mmol/L   Chloride 112 (H) 101 - 111 mmol/L   CO2 23 22 - 32 mmol/L   Glucose, Bld 97 65 - 99 mg/dL   BUN 16 6 - 20 mg/dL   Creatinine, Ser 0.81 0.44 - 1.00 mg/dL   Calcium 8.7 (L) 8.9 - 10.3 mg/dL   GFR calc non Af Amer 60 (L) >60 mL/min   GFR calc Af Amer >60 >60 mL/min    Comment: (NOTE) The eGFR has been calculated using the CKD EPI equation. This calculation has not been validated in all clinical situations. eGFR's persistently <60 mL/min signify possible Chronic Kidney Disease.    Anion gap 7 5 - 15    Dg Ankle Complete Left  Result Date: 06/08/2016 CLINICAL DATA:  Status post fall.  Left ankle swelling EXAM: LEFT ANKLE COMPLETE - 3+ VIEW COMPARISON:  None. FINDINGS: Severe osteopenia. Oblique nondisplaced fracture of the distal fibular metaphysis. No other fracture or dislocation. Intact ankle mortise. Moderate osteoarthritis of the  first tarsometatarsal joint. Mild osteoarthritis of the second and third tarsometatarsal joints. Peripheral vascular atherosclerotic disease. IMPRESSION: Acute oblique  nondisplaced fracture of the distal fibular metaphysis. Electronically Signed   By: Kathreen Devoid   On: 06/08/2016 18:11   Dg Chest Portable 1 View  Result Date: 06/08/2016 CLINICAL DATA:  Patient fell at 1 o'clock today. Sudden onset of bradycardia and hypoxemia. EXAM: PORTABLE CHEST 1 VIEW COMPARISON:  02/06/2015 CXR FINDINGS: Aortic atherosclerosis without aneurysm. Top normal-sized cardiac silhouette allowing for the AP projection. Both lungs are clear. Minimal atelectasis noted at the lung bases. No pleural effusion or pulmonary edema. No pneumothorax. Degenerative change of the dorsal spine and both shoulders. IMPRESSION: Aortic atherosclerosis.  Bibasilar atelectasis. Electronically Signed   By: Ashley Royalty M.D.   On: 06/08/2016 20:00    Review of Systems  Constitutional: Positive for malaise/fatigue and weight loss.  HENT: Positive for congestion and hearing loss.   Eyes: Negative.   Respiratory: Positive for shortness of breath.   Cardiovascular: Positive for palpitations.  Gastrointestinal: Negative.   Musculoskeletal: Positive for back pain, falls and myalgias.  Skin: Negative.   Neurological: Positive for dizziness and weakness.  Endo/Heme/Allergies: Negative.   Psychiatric/Behavioral: The patient is nervous/anxious.    Blood pressure (!) 173/52, pulse (!) 55, temperature 97.8 F (36.6 C), temperature source Oral, resp. rate 19, height 5' 2" (1.575 m), weight 60.1 kg (132 lb 8 oz), SpO2 96 %. Physical Exam  Nursing note and vitals reviewed. Constitutional: She is oriented to person, place, and time. She appears well-developed and well-nourished.  HENT:  Head: Normocephalic and atraumatic.  Eyes: Conjunctivae and EOM are normal. Pupils are equal, round, and reactive to light.  Neck: Normal range of motion. Neck supple.  Cardiovascular: Normal rate, regular rhythm and normal heart sounds.   Respiratory: Effort normal and breath sounds normal.  GI: Soft. Bowel sounds are  normal.  Musculoskeletal: Normal range of motion.  Neurological: She is alert and oriented to person, place, and time. She has normal reflexes.  Skin: Skin is warm and dry.  Psychiatric: She has a normal mood and affect.    Assessment/Plan: Bradycardia Falls Hypertension Closed Fibular fracture Dementia Hyperlipidemia TIA Sinusitis GERD Hypo-kalemia . PLAN Consider decreasing or discontinuing metoprolol because of bradycardia Continue blood pressure control DVT prophylaxis Corrected electrolytes Consider echocardiogram Do not recommend invasive strategy with cardiac catheter or even stress test Continue Aricept therapy for dementia Recommend Protonix therapy for reflux symptoms Continue use sinusitis therapy Consider physical therapy for evaluation and treatment of risk No clear indication for permanent pacemaker  Patricia Daniels 06/10/2016, 8:27 AM

## 2016-06-10 NOTE — Evaluation (Signed)
Occupational Therapy Evaluation Patient Details Name: Patricia Daniels MRN: [id number] DOB: [birth year] Today's Date: 06/10/2016    History of Present Illness  83 y.o. female who presents with Fall, L distal Fibula fracture. Here in the ED the patient found to be bradycardic and had a transient episode of hypoxia. Patient has dementia, HTN and TIA.     Clinical Impression    Pt. Is a 83 y.o. female who was admitted to [facility] after falling and sustained a L distal fibula fracture with no surgery recommended by Ortho.  She is confused but received pain meds earlier today for pain.  She is alert and cooperative and able to follow simple commands.  She has decreased AROM in RUE to about 75 degrees with pain when flexion past 75 degrees and NSG (Mandi and Geneva informed).  She is R handed and is non WB for LLE.  She requires min to mod assist for ADLs due to pain, limited ROM, weakness, limited activity tolerance, and impaired functional mobility for ADLs. Pt. Could benefit from skilled OT services for ADL and A/E retraining, and functional mobility for ADLs in order to improve overall ADL functioning, and return to PLOF.  Rec SNF after discharge from hospital to continue her rehab goals.    Follow Up Recommendations  SNF    Equipment Recommendations       Recommendations for Other Services       Precautions / Restrictions Precautions Precautions: Fall Restrictions Weight Bearing Restrictions: Yes LLE Weight Bearing: Non weight bearing      Mobility Bed Mobility                  Transfers                      Balance                                            ADL Overall ADL's : Needs assistance/impaired Eating/Feeding: Independent;Set up   Grooming: Wash/dry hands;Wash/dry face;Oral care;Brushing hair;Independent;Set up Grooming Details (indicate cue type and reason): pain in RUE when raising arm but able to complete task with set up and extra time          Upper Body Dressing : Set up;Minimal assistance Upper Body Dressing Details (indicate cue type and reason): due to pain in RUE and decreased AROM Lower Body Dressing: Moderate assistance;Set up Lower Body Dressing Details (indicate cue type and reason): non WB in LLE with pain in L elbow from IV and pain RUE with decreased AROM which limits ability to lean forward for LB dressing skills sitting EOB.                     Vision Baseline Vision/History: Wears glasses Wears Glasses: At all times Patient Visual Report: No change from baseline       Perception     Praxis      Pertinent Vitals/Pain Pain Assessment: 0-10 Faces Pain Scale: Hurts little more Pain Location: R shoulder and L leg and in L arm by IV site and NSG informed Pain Descriptors / Indicators: Aching;Discomfort Pain Intervention(s): Limited activity within patient's tolerance;Monitored during session;Premedicated before session;Repositioned     Hand Dominance Right   Extremity/Trunk Assessment Upper Extremity Assessment Upper Extremity Assessment: Generalized weakness;RUE deficits/detail RUE Deficits / Details: Decreased AROM in RUE to 75  degrees but no bruising or swelling so not clear if this is long standing; NSG updated   Lower Extremity Assessment Lower Extremity Assessment: Defer to PT evaluation LLE Deficits / Details: ankle splint in place       Communication Communication Communication: No difficulties   Cognition Arousal/Alertness: Awake/alert Behavior During Therapy: WFL for tasks assessed/performed (mildly confused) Overall Cognitive Status: Difficult to assess                 General Comments: Pt with some mild confusion, but able to follow commands for eval.   General Comments       Exercises       Shoulder Instructions      Home Living Family/patient expects to be discharged to:: Skilled nursing facility                                  Additional Comments: Difficult to assess due to pt confused but she stated she lives in [place] with her husband and was driving and independent in all ADLs.  NO family present to clarify.  Eric from SW said she was living in [place] with her son.       Prior Functioning/Environment                   OT Problem List: Decreased strength;Decreased range of motion;Decreased activity tolerance;Pain;Decreased safety awareness;Decreased knowledge of use of DME or AE      OT Treatment/Interventions: Self-care/ADL training;Patient/family education;DME and/or AE instruction;Therapeutic activities;Balance training    OT Goals(Current goals can be found in the care plan section) Acute Rehab OT Goals Patient Stated Goal: "to go home again" OT Goal Formulation: With patient Time For Goal Achievement: 06/24/16 Potential to Achieve Goals: Fair ADL Goals Pt Will Perform Lower Body Dressing: with min assist;with adaptive equipment;sit to/from stand (using FWW and no LOB) Pt Will Transfer to Toilet: with set-up;with min assist;bedside commode (BSC over toilet and FWW while non WB LLE)  OT Frequency: Min 1X/week   Barriers to D/C:            Co-evaluation              End of Session Nurse Communication: Other (comment) (pain in IV site LUe and pain and limited AROm RUE with no xrays after fall (spoke to [place] and [place]))  Activity Tolerance: Patient tolerated treatment well Patient left: in bed;with call bell/phone within reach;with bed alarm set  OT Visit Diagnosis: Muscle weakness (generalized) (M62.81);Pain Pain - Right/Left: Left Pain - part of body: Leg                ADL either performed or assessed with clinical judgement  Time: 1330-1410 OT Time Calculation (min): 40 min Charges:  OT General Charges $OT Visit: 1 Procedure OT Evaluation $OT Eval Moderate Complexity: 1 Procedure OT Treatments $Self Care/Home Management : 23-37 mins G-Codes:     {Susan Wofford,  OTR/L ascom [phone] 06/10/16, 2:26 PM

## 2016-06-10 NOTE — Plan of Care (Signed)
Problem: Education: Goal: Knowledge of  General Education information/materials will improve Outcome: Progressing Patient is understanding of hospital information. Patient does have dementia.   Comments: Patient is doing well. Has been experiencing some pain today. Patient will be discharged tomorrow to SNF.

## 2016-06-10 NOTE — Clinical Social Work Note (Signed)
CSW presented bed offers to patient and she chose [facility].  CSW contacted [facility] who will begin Therapist, occupationalinsurance approval through [employer].  CSW attempted to call patient's son again, and there was no answer.  Ervin KnackEric R. Laresha Bacorn, MSW, Theresia MajorsLCSWA [phone]  06/10/2016 11:17 AM

## 2016-06-10 NOTE — Clinical Social Work Note (Signed)
Clinical Social Work Assessment  Patient Details  Name: Patricia Daniels MRN: [id number] Date of Birth: 09-05-1919  Date of referral:  06/10/16               Reason for consult:  Facility Placement                Permission sought to share information with:  Family Supports, Magazine features editoracility Contact Representative Permission granted to share information::  Yes, Verbal Permission Granted  Name::     [name],[name] [phone]   Agency::  SNF admissions  Relationship::     Contact Information:     Housing/Transportation Living arrangements for the past 2 months:  Single Family Home Source of Information:  Patient Patient Interpreter Needed:  None Criminal Activity/Legal Involvement Pertinent to Current Situation/Hospitalization:  No - Comment as needed Significant Relationships:  Adult Children Lives with:  Adult Children Do you feel safe going back to the place where you live?  No Need for family participation in patient care:  No (Coment)  Care giving concerns: Patient feels she needs some short term rehab before she is able to return back home.  Social Worker assessment / plan:  Patient is a 83 year old female who is alert and oriented x4 and able to express her feelings, patient expressed that she still makes her own decisions.  Patient states she has not been to rehab before, CSW explained to patient what to expect at rehab and what the process is for placement.  Patient at first stated she did not feel like she needs rehab, however after she thought about it she decided it would be good for her.  CSW explained to patient how insurance will pay for her stay.  Patient expressed understanding, and gave CSW permission to begin bed [place] process [place].  Patient did not have any other questions or concerns.  Employment status:  Retired Database administratornsurance information:  Managed Medicare PT Recommendations:  Skilled Nursing Facility Information / Referral to community resources:   Skilled Nursing Facility  Patient/Family's Response to care:  Patient in agreement to going to SNF for short term rehab.  Patient/Family's Understanding of and Emotional Response to Diagnosis, Current Treatment, and Prognosis:  Patient is aware of current prognosis and treatment plan, she hopes she will not be in SNF very long.  Emotional Assessment Appearance:  Appears stated age Attitude/Demeanor/Rapport:    Affect (typically observed):  Appropriate, Calm, Stable Orientation:  Oriented to Place, Oriented to Self, Oriented to  Time, Oriented to Situation Alcohol / Substance use:  Not Applicable Psych involvement (Current and /or in the community):  No (Comment)  Discharge Needs  Concerns to be addressed:  Lack of Support Readmission within the last 30 days:  No Current discharge risk:  Lack of support system Barriers to Discharge:  Insurance Authorization   [place] [employer], Nalin Mazzocco R, LCSWA 06/10/2016, 11:29 AM

## 2016-06-11 DIAGNOSIS — E785 Hyperlipidemia, unspecified: Secondary | ICD-10-CM | POA: Diagnosis not present

## 2016-06-11 DIAGNOSIS — R001 Bradycardia, unspecified: Secondary | ICD-10-CM | POA: Diagnosis not present

## 2016-06-11 DIAGNOSIS — F39 Unspecified mood [affective] disorder: Secondary | ICD-10-CM | POA: Diagnosis not present

## 2016-06-11 DIAGNOSIS — W19XXXD Unspecified fall, subsequent encounter: Secondary | ICD-10-CM | POA: Diagnosis not present

## 2016-06-11 DIAGNOSIS — S82892A Other fracture of left lower leg, initial encounter for closed fracture: Secondary | ICD-10-CM | POA: Diagnosis not present

## 2016-06-11 DIAGNOSIS — S82822D Torus fracture of lower end of left fibula, subsequent encounter for fracture with routine healing: Secondary | ICD-10-CM | POA: Diagnosis not present

## 2016-06-11 DIAGNOSIS — S82435D Nondisplaced oblique fracture of shaft of left fibula, subsequent encounter for closed fracture with routine healing: Secondary | ICD-10-CM | POA: Diagnosis not present

## 2016-06-11 DIAGNOSIS — K219 Gastro-esophageal reflux disease without esophagitis: Secondary | ICD-10-CM | POA: Diagnosis not present

## 2016-06-11 DIAGNOSIS — M8000XD Age-related osteoporosis with current pathological fracture, unspecified site, subsequent encounter for fracture with routine healing: Secondary | ICD-10-CM | POA: Diagnosis not present

## 2016-06-11 DIAGNOSIS — J4520 Mild intermittent asthma, uncomplicated: Secondary | ICD-10-CM | POA: Diagnosis not present

## 2016-06-11 DIAGNOSIS — W19XXXA Unspecified fall, initial encounter: Secondary | ICD-10-CM | POA: Diagnosis not present

## 2016-06-11 DIAGNOSIS — Z4689 Encounter for fitting and adjustment of other specified devices: Secondary | ICD-10-CM | POA: Diagnosis not present

## 2016-06-11 DIAGNOSIS — Z7401 Bed confinement status: Secondary | ICD-10-CM | POA: Diagnosis not present

## 2016-06-11 DIAGNOSIS — S82822A Torus fracture of lower end of left fibula, initial encounter for closed fracture: Secondary | ICD-10-CM | POA: Diagnosis not present

## 2016-06-11 DIAGNOSIS — S82409A Unspecified fracture of shaft of unspecified fibula, initial encounter for closed fracture: Secondary | ICD-10-CM | POA: Diagnosis not present

## 2016-06-11 DIAGNOSIS — S8265XA Nondisplaced fracture of lateral malleolus of left fibula, initial encounter for closed fracture: Secondary | ICD-10-CM | POA: Diagnosis not present

## 2016-06-11 DIAGNOSIS — I10 Essential (primary) hypertension: Secondary | ICD-10-CM | POA: Diagnosis not present

## 2016-06-11 DIAGNOSIS — Z7901 Long term (current) use of anticoagulants: Secondary | ICD-10-CM | POA: Diagnosis not present

## 2016-06-11 DIAGNOSIS — F0390 Unspecified dementia without behavioral disturbance: Secondary | ICD-10-CM | POA: Diagnosis not present

## 2016-06-11 DIAGNOSIS — S8292XA Unspecified fracture of left lower leg, initial encounter for closed fracture: Secondary | ICD-10-CM | POA: Diagnosis not present

## 2016-06-11 DIAGNOSIS — M25572 Pain in left ankle and joints of left foot: Secondary | ICD-10-CM | POA: Diagnosis not present

## 2016-06-11 LAB — BASIC METABOLIC PANEL
Anion gap: 6 (ref 5–15)
BUN: 15 mg/dL (ref 6–20)
CHLORIDE: 109 mmol/L (ref 101–111)
CO2: 25 mmol/L (ref 22–32)
CREATININE: 1.01 mg/dL — AB (ref 0.44–1.00)
Calcium: 8.5 mg/dL — ABNORMAL LOW (ref 8.9–10.3)
GFR calc non Af Amer: 46 mL/min — ABNORMAL LOW (ref >60)
GFR, EST AFRICAN AMERICAN: 53 mL/min — AB (ref >60)
Glucose, Bld: 99 mg/dL (ref 65–99)
POTASSIUM: 3.5 mmol/L (ref 3.5–5.1)
Sodium: 140 mmol/L (ref 135–145)

## 2016-06-11 MED ORDER — OXYCODONE HCL 10 MG PO TABS: 10.0000 mg | ORAL_TABLET | ORAL | 0 refills | Status: DC | PRN

## 2016-06-11 MED ORDER — ENOXAPARIN SODIUM 30 MG/0.3ML ~~LOC~~ SOLN: 30.0000 mg | Freq: Every day | SUBCUTANEOUS | Status: DC

## 2016-06-11 MED ORDER — ACETAMINOPHEN 325 MG PO TABS: 650.0000 mg | ORAL_TABLET | Freq: Four times a day (QID) | ORAL | Status: AC | PRN

## 2016-06-11 MED ORDER — LISINOPRIL 10 MG PO TABS
10.0000 mg | ORAL_TABLET | Freq: Every day | ORAL | Status: DC
  Administered 2016-06-11: 10 mg via ORAL
  Filled 2016-06-11: qty 1

## 2016-06-11 MED ORDER — LISINOPRIL 5 MG PO TABS: 10.0000 mg | ORAL_TABLET | Freq: Every day | ORAL | Status: AC

## 2016-06-11 MED ORDER — ENOXAPARIN SODIUM 40 MG/0.4ML ~~LOC~~ SOLN: 40.0000 mg | Freq: Every day | SUBCUTANEOUS | Status: DC

## 2016-06-11 NOTE — Progress Notes (Addendum)
Patient was removed from short leg splint and skin examined underneath.  Two small areas, one along the dorsum of the patients left foot and one along the medial aspect of the ankle where small pressure ulcers appear to be forming.  No open wound or formed ulcer, mild ecchymosis along the lateral aspect of the ankle.  The patient was placed into a short leg cast with extra padding placed around the ankle and foot, foot flexed to 90 degrees of dorsiflexion.  She was instructed to remain non-weightbearing tot he left lower extremity as much as possible.  Follow-up with me in the office in two weeks for cast removal and skin check.  Valeria BatmanJ. Lance McGhee, PA-C [facility]   Order for post op shoe placed in patient's EMR to protect her toes. The patient may apply partial weight on her left foot while in cast.  J. Derald MacleodJeffrey Jame Morrell, MD [facility] [phone]

## 2016-06-11 NOTE — Discharge Instructions (Signed)
[facility] [place] [facility]  DIET:  Cardiac diet  DISCHARGE CONDITION:  Stable  ACTIVITY:  As per orthopedics  OXYGEN:  Home Oxygen: No.   Oxygen Delivery: room air  DISCHARGE LOCATION:  nursing home    ADDITIONAL DISCHARGE INSTRUCTION: please follow directions on acitvity per orthopedic md   If you experience worsening of your admission symptoms, develop shortness of breath, life threatening emergency, suicidal or homicidal thoughts you must seek medical attention immediately by calling 911 or calling your MD immediately  if symptoms less severe.  You Must read complete instructions/literature along with all the possible adverse reactions/side effects for all the Medicines you take and that have been prescribed to you. Take any new Medicines after you have completely understood and accpet all the possible adverse reactions/side effects.   Please note  You were cared for by a hospitalist during your hospital stay. If you have any questions about your discharge medications or the care you received while you were in the hospital after you are discharged, you can call the unit and asked to speak with the hospitalist on call if the hospitalist that took care of you is not available. Once you are discharged, your primary care physician will handle any further medical issues. Please note that NO REFILLS for any discharge medications will be authorized once you are discharged, as it is imperative that you return to your primary care physician (or establish a relationship with a primary care physician if you do not have one) for your aftercare needs so that they can reassess your need for medications and monitor your lab values.

## 2016-06-11 NOTE — Progress Notes (Signed)
Pharmacist - Prescriber Communication  Enoxaparin dose modified to 30 mg subcutaneously once daily due to creatinine clearance now < 30 mL/min. (Crcl 26.4 ml/min)  Bari MantisKristin Daaiyah Baumert PharmD Clinical Pharmacist 06/11/2016

## 2016-06-11 NOTE — Clinical Social Work Note (Signed)
CSW received phone call from [facility] that they have received insurance authorization for patient to go to SNF.  Patient's son Patricia Daniels, [phone] or [phone]  was informed that patient will be discharging today to [facility].  Patient to be d/c'ed today to [facility].  Patient and family agreeable to plans will transport via ems RN to call report to [phone] room [phone].  Windell MouldingEric Tarshia Kot, MSW, Theresia MajorsLCSWA [phone]

## 2016-06-11 NOTE — Discharge Summary (Signed)
[facility] - [place] at [facility]  Patricia Daniels, [place] y.o[place] DOB [birth year], MRN [id number]. Admission date: 06/08/2016 Discharge Date 06/11/2016 Primary MD No PCP Per Patient Admitting Physician [name], MD  Admission Diagnosis  Hypoxemia [R09.02] Bradycardia [R00.1] Fall, initial encounter [W19.XXXA] Closed nondisplaced oblique fracture of shaft of left fibula, initial encounter [S82.435A]  Discharge Diagnosis   Principal Problem: Bradycardia due to metoprolol Fall Closed fibular fracture HTN (hypertension) HLD (hyperlipidemia) dmentia [facility] Course   Patricia Daniels  is a 83 y.o. female who presents with Fall, Fibula fracture. Here in the ED the patient found to be bradycardic and had a transient episode of hypoxia. Patient's hypoxia resolved. She was noted to have bradycardia. However she was on metoprolol which was discontinued. Her bradycardia resolved. She was seen in consultation by cardiology. Who recommended continuing monitor of her heart.Patient also had nondisplaced left istal fibular fracture. Patient was seen byy felt that this was nonsurgical therapy and recommended nonweightbearing bearing status. They will see her prior to her being discharged to the skilled nursing facility for activity orders and placement of short boot cast. Patient has had pain that is been controlled with pain medications. She is currently stable and in need of further rehabilitation.          Consults  cardiology, orthopedics  Significant Tests:  See full reports for all details     Dg Ankle Complete Left  Result Date: 06/08/2016 CLINICAL DATA:  Status post fall.  Left ankle swelling EXAM: LEFT ANKLE COMPLETE - 3+ VIEW COMPARISON:  None. FINDINGS: Severe osteopenia. Oblique nondisplaced fracture of the distal fibular metaphysis. No other fracture or dislocation. Intact ankle mortise. Moderate osteoarthritis of the first tarsometatarsal joint. Mild  osteoarthritis of the second and third tarsometatarsal joints. Peripheral vascular atherosclerotic disease. IMPRESSION: Acute oblique nondisplaced fracture of the distal fibular metaphysis. Electronically Signed   By: Elige Ko   On: 06/08/2016 18:11   Dg Chest Portable 1 View  Result Date: 06/08/2016 CLINICAL DATA:  Patient fell at 1 o'clock today. Sudden onset of bradycardia and hypoxemia. EXAM: PORTABLE CHEST 1 VIEW COMPARISON:  02/06/2015 CXR FINDINGS: Aortic atherosclerosis without aneurysm. Top normal-sized cardiac silhouette allowing for the AP projection. Both lungs are clear. Minimal atelectasis noted at the lung bases. No pleural effusion or pulmonary edema. No pneumothorax. Degenerative change of the dorsal spine and both shoulders. IMPRESSION: Aortic atherosclerosis.  Bibasilar atelectasis. Electronically Signed   By: Tollie Eth M.D.   On: 06/08/2016 20:00       Today   Subjective:   Patricia Daniels  Patient complains of pain in the leg but other than that denies any other significant complaints  Objective:   Blood pressure (!) 158/46, pulse 78, temperature 98.2 F (36.8 C), temperature source Oral, resp. rate 18, height 5\' 2"  (1.575 m), weight 132 lb 8 oz (60.1 kg), SpO2 91 %.  .  Intake/Output Summary (Last 24 hours) at 06/11/16 0844 Last data filed at 06/10/16 2300  Gross per 24 hour  Intake                3 ml  Output                0 ml  Net                3 ml    Exam VITAL SIGNS: Blood pressure (!) 158/46, pulse 78, temperature 98.2 F (36.8 C),  temperature source Oral, resp. rate 18, height 5\' 2"  (1.575 m), weight 132 lb 8 oz (60.1 kg), SpO2 91 %.  GENERAL:  83 y.o.-year-old patient lying in the bed with no acute distress.  EYES: Pupils equal, round, reactive to light and accommodation. No scleral icterus. Extraocular muscles intact.  HEENT: Head atraumatic, normocephalic. Oropharynx and nasopharynx clear.  NECK:  Supple, no jugular venous distention. No thyroid  enlargement, no tenderness.  LUNGS: Normal breath sounds bilaterally, no wheezing, rales,rhonchi or crepitation. No use of accessory muscles of respiration.  CARDIOVASCULAR: S1, S2 normal. No murmurs, rubs, or gallops.  ABDOMEN: Soft, nontender, nondistended. Bowel sounds present. No organomegaly or mass.  EXTREMITIES: No pedal edema, cyanosis, or clubbing.  NEUROLOGIC: Cranial nerves II through XII are intact. Muscle strength 5/5 in all extremities. Sensation intact. Gait not checked.  PSYCHIATRIC: The patient is alert and oriented x 3.  SKIN: No obvious rash, lesion, or ulcer.   Data Review     CBC w Diff:  Lab Results  Component Value Date   WBC 9.0 06/09/2016   HGB 12.2 06/09/2016   HGB 12.7 04/24/2011   HCT 34.9 (L) 06/09/2016   HCT 37.5 04/24/2011   PLT 293 06/09/2016   PLT 293 04/24/2011   CMP:  Lab Results  Component Value Date   NA 140 06/11/2016   NA 142 04/24/2011   K 3.5 06/11/2016   K 4.0 04/24/2011   CL 109 06/11/2016   CL 108 (H) 04/24/2011   CO2 25 06/11/2016   CO2 26 04/24/2011   BUN 15 06/11/2016   BUN 20 (H) 04/24/2011   CREATININE 1.01 (H) 06/11/2016   CREATININE 0.91 04/24/2011   PROT 6.8 02/06/2015   PROT 7.1 04/24/2011   ALBUMIN 3.8 02/06/2015   ALBUMIN 3.7 04/24/2011   BILITOT 0.4 02/06/2015   BILITOT 0.5 04/24/2011   ALKPHOS 62 02/06/2015   ALKPHOS 62 04/24/2011   AST 17 02/06/2015   AST 19 04/24/2011   ALT 9 (L) 02/06/2015   ALT 16 04/24/2011  .  Micro Results No results found for this or any previous visit (from the past 240 hour(s)).      Code Status Orders        Start     Ordered   06/08/16 2253  Do not attempt resuscitation (DNR)  Continuous    Question Answer Comment  In the event of cardiac or respiratory ARREST Do not call a "code blue"   In the event of cardiac or respiratory ARREST Do not perform Intubation, CPR, defibrillation or ACLS   In the event of cardiac or respiratory ARREST Use medication by any route,  position, wound care, and other measures to relive pain and suffering. May use oxygen, suction and manual treatment of airway obstruction as needed for comfort.      06/08/16 2252    Code Status History    Date Active Date Inactive Code Status Order ID Comments User Context   This patient has a current code status but no historical code status.    Advance Directive Documentation   Flowsheet Row Most Recent Value  Type of Advance Directive  Out of facility DNR (pink MOST or yellow form)  Pre-existing out of facility DNR order (yellow form or pink MOST form)  No data  "MOST" Form in Place?  No data           Contact information for follow-up providers    Christena Flake, MD Follow up in 2 week(s).  Specialty:  Surgery Contact information: [address] [facility] [place] [id number] [phone]            Contact information for after-discharge care    Destination    [facility] Follow up.   Specialty:  Skilled Nursing Facility Contact information: [address] [place] [id number] [phone]                  Discharge Medications   Allergies as of 06/11/2016      Reactions   Codeine Other (See Comments)   Son states it makes her "not feel good"      Medication List    STOP taking these medications   metoprolol succinate 25 MG 24 hr tablet Commonly known as:  TOPROL-XL     TAKE these medications   acetaminophen 325 MG tablet Commonly known as:  TYLENOL Take 2 tablets (650 mg total) by mouth every 6 (six) hours as needed for mild pain (or Fever >/= 101).   cetirizine 10 MG tablet Commonly known as:  ZYRTEC Take 10 mg by mouth daily.   donepezil 10 MG tablet Commonly known as:  ARICEPT Take 10 mg by mouth at bedtime.   enoxaparin 40 MG/0.4ML injection Commonly known as:  LOVENOX Inject 0.4 mLs (40 mg total) into the skin at bedtime.   lisinopril 5 MG tablet Commonly known  as:  PRINIVIL,ZESTRIL Take 2 tablets (10 mg total) by mouth daily. What changed:  how much to take   montelukast 10 MG tablet Commonly known as:  SINGULAIR Take 10 mg by mouth at bedtime.   Oxycodone HCl 10 MG Tabs Take 1 tablet (10 mg total) by mouth every 4 (four) hours as needed for moderate pain.   pantoprazole 40 MG tablet Commonly known as:  PROTONIX Take 40 mg by mouth daily.   topiramate 25 MG tablet Commonly known as:  TOPAMAX Take 25 mg by mouth at bedtime.          Total Time in preparing paper work, data evaluation and todays exam - 35 minutes  Auburn BilberryPATEL, Jarmel Linhardt M.D on 06/11/2016 at 8:44 AM  [facility]   Office  [phone]

## 2016-06-11 NOTE — Clinical Social Work Placement (Signed)
   CLINICAL SOCIAL WORK PLACEMENT  NOTE  Date:  06/11/2016  Patient Details  Name: Patricia Daniels MRN: [id number] Date of Birth: June 04, 1919  Clinical Social Work is seeking post-discharge placement for this patient at the Skilled  Nursing Facility level of care (*CSW will initial, date and re-position this form in  chart as items are completed):  Yes   Patient/family provided with [place] Clinical Social Work Department's list of facilities offering this level of care within the geographic area requested by the patient (or if unable, by the patient's family).  Yes   Patient/family informed of their freedom to choose among providers that offer the needed level of care, that participate in Medicare, Medicaid or managed care program needed by the patient, have an available bed and are willing to accept the patient.  Yes   Patient/family informed of [place]'s ownership interest in [facility] and [facility], as well as of the fact that they are under no obligation to receive care at these facilities.  PASRR submitted to EDS on 06/09/16     PASRR number received on 06/09/16     Existing PASRR number confirmed on       FL2 transmitted to all facilities in geographic area requested by pt/family on 06/09/16     FL2 transmitted to all facilities within larger geographic area on       Patient informed that his/her managed care company has contracts with or will negotiate with certain facilities, including the following:        Yes   Patient/family informed of bed offers received.  [facility]     Physician recommends and patient chooses bed at      Patient to be [facility] on 06/11/16.  [facility]     Patient family notified on 06/11/16 of transfer.  Name of family member notified:  Patient's son [name]     PHYSICIAN       Additional Comment:     _______________________________________________ Darleene CleaverAnterhaus, Amalio Loe R, LCSWA 06/11/2016, 4:09 PM

## 2016-06-11 NOTE — Progress Notes (Addendum)
Report called to [facility] / Tresa EndoKelly received report/ tele removed/ EMS called to transport/ post op shoe applied per MD order

## 2016-06-12 DIAGNOSIS — R001 Bradycardia, unspecified: Secondary | ICD-10-CM | POA: Diagnosis not present

## 2016-06-12 DIAGNOSIS — M8000XD Age-related osteoporosis with current pathological fracture, unspecified site, subsequent encounter for fracture with routine healing: Secondary | ICD-10-CM | POA: Diagnosis not present

## 2016-06-12 DIAGNOSIS — I10 Essential (primary) hypertension: Secondary | ICD-10-CM | POA: Diagnosis not present

## 2016-06-15 DIAGNOSIS — W19XXXA Unspecified fall, initial encounter: Secondary | ICD-10-CM | POA: Diagnosis not present

## 2016-06-15 DIAGNOSIS — S8292XA Unspecified fracture of left lower leg, initial encounter for closed fracture: Secondary | ICD-10-CM | POA: Diagnosis not present

## 2016-06-18 ENCOUNTER — Other Ambulatory Visit: Payer: Self-pay | Admitting: *Deleted

## 2016-06-18 NOTE — Patient Outreach (Signed)
[facility]) Care Management  06/18/2016  Patricia Daniels 05-23-1919 [id number]   Met with Helene Kelp, SW at facility. She reports patient is from home, tentative plan is for patient to return home, son is caregiver.  She states patient is moving slow with therapy. Discussed that patient is eligible for [facility] care management services.  She reports RNCM will need to speak with son.  Plan to attempt to speak with son at next facility visit if he is not available will call.  Royetta Crochet. Laymond Purser, RN, BSN, [place] [phone]) Business Cell  [phone]) Toll Free Office

## 2016-06-24 DIAGNOSIS — W19XXXA Unspecified fall, initial encounter: Secondary | ICD-10-CM | POA: Diagnosis not present

## 2016-06-24 DIAGNOSIS — S8292XA Unspecified fracture of left lower leg, initial encounter for closed fracture: Secondary | ICD-10-CM | POA: Diagnosis not present

## 2016-06-25 DIAGNOSIS — M25572 Pain in left ankle and joints of left foot: Secondary | ICD-10-CM | POA: Diagnosis not present

## 2016-06-25 DIAGNOSIS — S82822A Torus fracture of lower end of left fibula, initial encounter for closed fracture: Secondary | ICD-10-CM | POA: Diagnosis not present

## 2016-07-01 NOTE — Progress Notes (Signed)
Pt discharged to [facility] on 06/09/2016. Medication reconciliation completed by PharmD with [facility] on 06/30/2016. No issues noted.   Sandi CarneNick Desyre Calma, PharmD, BCPS Pharmacy Resident Pager: [phone]

## 2016-07-17 ENCOUNTER — Other Ambulatory Visit: Payer: Self-pay | Admitting: *Deleted

## 2016-07-17 DIAGNOSIS — M25572 Pain in left ankle and joints of left foot: Secondary | ICD-10-CM | POA: Diagnosis not present

## 2016-07-17 DIAGNOSIS — S82822D Torus fracture of lower end of left fibula, subsequent encounter for fracture with routine healing: Secondary | ICD-10-CM | POA: Diagnosis not present

## 2016-07-17 NOTE — Patient Outreach (Signed)
Spoke with Mellissa Kohut, SW at facility. She reports patient doing well, no discharge date set anticipate in the next 1-2 weeks. She reports plan is to go home with private pay sitters and son for 24/7 oversight.  Son is Genoveva Ill [phone]  Call to patient son, Fayrene Fearing regarding [facility] care management.  No answer, unable to leave a message.   Plan will attempt outreach again next week. Alben Spittle. Albertha Ghee, RN, BSN, CCM  Post Acute Chartered loss adjuster [phone]) Business Cell  [phone]) Toll Free Office

## 2016-07-20 ENCOUNTER — Other Ambulatory Visit: Payer: Self-pay | Admitting: *Deleted

## 2016-07-20 NOTE — Patient Outreach (Signed)
Call attempted to patient son, Genoveva Ill.  No answer, unable to leave a message as no voicemail set up. Plan to attempt again later. Alben Spittle. Albertha Ghee, RN, BSN, CCM  Post Acute Chartered loss adjuster [phone]) Business Cell  [phone]) Toll Free Office

## 2016-07-23 ENCOUNTER — Other Ambulatory Visit: Payer: Self-pay | Admitting: *Deleted

## 2016-07-23 NOTE — Patient Outreach (Signed)
[facility] ([facility]) Care Management  07/23/2016  Patricia Daniels 06/18/1919 [id number]   Met with Teresa Cooper, SW at facility. Explained RNCM had called patient son several times with no return calls or answer to call attempts.  Requested that SW give patient son a [facility] care management brochure, she agrees to put in patient discharge packet. She reports patient going home 07/25/16 with 24 hour round the clock sitters. Also, she is setting up home care through [facility] home health services.   Plan to sign off as patient family will have [facility] program and RNCM contact.   E. , RN, BSN, CCM  Post Acute Care Coordinator [facility] ([phone]) Business Cell  [phone]) Toll Free Office 

## 2016-07-28 DIAGNOSIS — S82435D Nondisplaced oblique fracture of shaft of left fibula, subsequent encounter for closed fracture with routine healing: Secondary | ICD-10-CM | POA: Diagnosis not present

## 2016-07-28 DIAGNOSIS — Z9181 History of falling: Secondary | ICD-10-CM | POA: Diagnosis not present

## 2016-07-28 DIAGNOSIS — F0390 Unspecified dementia without behavioral disturbance: Secondary | ICD-10-CM | POA: Diagnosis not present

## 2016-07-28 DIAGNOSIS — E785 Hyperlipidemia, unspecified: Secondary | ICD-10-CM | POA: Diagnosis not present

## 2016-07-28 DIAGNOSIS — I10 Essential (primary) hypertension: Secondary | ICD-10-CM | POA: Diagnosis not present

## 2016-07-28 DIAGNOSIS — J4520 Mild intermittent asthma, uncomplicated: Secondary | ICD-10-CM | POA: Diagnosis not present

## 2016-07-28 DIAGNOSIS — K219 Gastro-esophageal reflux disease without esophagitis: Secondary | ICD-10-CM | POA: Diagnosis not present

## 2016-07-28 DIAGNOSIS — Z8673 Personal history of transient ischemic attack (TIA), and cerebral infarction without residual deficits: Secondary | ICD-10-CM | POA: Diagnosis not present

## 2016-07-28 DIAGNOSIS — E7800 Pure hypercholesterolemia, unspecified: Secondary | ICD-10-CM | POA: Diagnosis not present

## 2016-07-30 DIAGNOSIS — S82435D Nondisplaced oblique fracture of shaft of left fibula, subsequent encounter for closed fracture with routine healing: Secondary | ICD-10-CM | POA: Diagnosis not present

## 2016-07-30 DIAGNOSIS — K219 Gastro-esophageal reflux disease without esophagitis: Secondary | ICD-10-CM | POA: Diagnosis not present

## 2016-07-30 DIAGNOSIS — F0390 Unspecified dementia without behavioral disturbance: Secondary | ICD-10-CM | POA: Diagnosis not present

## 2016-07-30 DIAGNOSIS — E7800 Pure hypercholesterolemia, unspecified: Secondary | ICD-10-CM | POA: Diagnosis not present

## 2016-07-30 DIAGNOSIS — J4520 Mild intermittent asthma, uncomplicated: Secondary | ICD-10-CM | POA: Diagnosis not present

## 2016-07-30 DIAGNOSIS — E785 Hyperlipidemia, unspecified: Secondary | ICD-10-CM | POA: Diagnosis not present

## 2016-07-30 DIAGNOSIS — Z9181 History of falling: Secondary | ICD-10-CM | POA: Diagnosis not present

## 2016-07-30 DIAGNOSIS — Z8673 Personal history of transient ischemic attack (TIA), and cerebral infarction without residual deficits: Secondary | ICD-10-CM | POA: Diagnosis not present

## 2016-07-30 DIAGNOSIS — I10 Essential (primary) hypertension: Secondary | ICD-10-CM | POA: Diagnosis not present

## 2016-08-06 DIAGNOSIS — K219 Gastro-esophageal reflux disease without esophagitis: Secondary | ICD-10-CM | POA: Diagnosis not present

## 2016-08-06 DIAGNOSIS — E785 Hyperlipidemia, unspecified: Secondary | ICD-10-CM | POA: Diagnosis not present

## 2016-08-06 DIAGNOSIS — Z8673 Personal history of transient ischemic attack (TIA), and cerebral infarction without residual deficits: Secondary | ICD-10-CM | POA: Diagnosis not present

## 2016-08-06 DIAGNOSIS — I10 Essential (primary) hypertension: Secondary | ICD-10-CM | POA: Diagnosis not present

## 2016-08-06 DIAGNOSIS — J4520 Mild intermittent asthma, uncomplicated: Secondary | ICD-10-CM | POA: Diagnosis not present

## 2016-08-06 DIAGNOSIS — Z9181 History of falling: Secondary | ICD-10-CM | POA: Diagnosis not present

## 2016-08-06 DIAGNOSIS — E7800 Pure hypercholesterolemia, unspecified: Secondary | ICD-10-CM | POA: Diagnosis not present

## 2016-08-06 DIAGNOSIS — F0390 Unspecified dementia without behavioral disturbance: Secondary | ICD-10-CM | POA: Diagnosis not present

## 2016-08-06 DIAGNOSIS — S82435D Nondisplaced oblique fracture of shaft of left fibula, subsequent encounter for closed fracture with routine healing: Secondary | ICD-10-CM | POA: Diagnosis not present

## 2016-08-07 DIAGNOSIS — S82435D Nondisplaced oblique fracture of shaft of left fibula, subsequent encounter for closed fracture with routine healing: Secondary | ICD-10-CM | POA: Diagnosis not present

## 2016-08-07 DIAGNOSIS — Z8673 Personal history of transient ischemic attack (TIA), and cerebral infarction without residual deficits: Secondary | ICD-10-CM | POA: Diagnosis not present

## 2016-08-07 DIAGNOSIS — F0390 Unspecified dementia without behavioral disturbance: Secondary | ICD-10-CM | POA: Diagnosis not present

## 2016-08-07 DIAGNOSIS — Z9181 History of falling: Secondary | ICD-10-CM | POA: Diagnosis not present

## 2016-08-07 DIAGNOSIS — E7800 Pure hypercholesterolemia, unspecified: Secondary | ICD-10-CM | POA: Diagnosis not present

## 2016-08-07 DIAGNOSIS — I10 Essential (primary) hypertension: Secondary | ICD-10-CM | POA: Diagnosis not present

## 2016-08-07 DIAGNOSIS — K219 Gastro-esophageal reflux disease without esophagitis: Secondary | ICD-10-CM | POA: Diagnosis not present

## 2016-08-07 DIAGNOSIS — J4520 Mild intermittent asthma, uncomplicated: Secondary | ICD-10-CM | POA: Diagnosis not present

## 2016-08-07 DIAGNOSIS — E785 Hyperlipidemia, unspecified: Secondary | ICD-10-CM | POA: Diagnosis not present

## 2016-08-10 DIAGNOSIS — Z9181 History of falling: Secondary | ICD-10-CM | POA: Diagnosis not present

## 2016-08-10 DIAGNOSIS — S82435D Nondisplaced oblique fracture of shaft of left fibula, subsequent encounter for closed fracture with routine healing: Secondary | ICD-10-CM | POA: Diagnosis not present

## 2016-08-10 DIAGNOSIS — I10 Essential (primary) hypertension: Secondary | ICD-10-CM | POA: Diagnosis not present

## 2016-08-10 DIAGNOSIS — K219 Gastro-esophageal reflux disease without esophagitis: Secondary | ICD-10-CM | POA: Diagnosis not present

## 2016-08-10 DIAGNOSIS — Z8673 Personal history of transient ischemic attack (TIA), and cerebral infarction without residual deficits: Secondary | ICD-10-CM | POA: Diagnosis not present

## 2016-08-10 DIAGNOSIS — E7800 Pure hypercholesterolemia, unspecified: Secondary | ICD-10-CM | POA: Diagnosis not present

## 2016-08-10 DIAGNOSIS — F0390 Unspecified dementia without behavioral disturbance: Secondary | ICD-10-CM | POA: Diagnosis not present

## 2016-08-10 DIAGNOSIS — J4520 Mild intermittent asthma, uncomplicated: Secondary | ICD-10-CM | POA: Diagnosis not present

## 2016-08-10 DIAGNOSIS — E785 Hyperlipidemia, unspecified: Secondary | ICD-10-CM | POA: Diagnosis not present

## 2016-08-12 DIAGNOSIS — K219 Gastro-esophageal reflux disease without esophagitis: Secondary | ICD-10-CM | POA: Diagnosis not present

## 2016-08-12 DIAGNOSIS — Z8673 Personal history of transient ischemic attack (TIA), and cerebral infarction without residual deficits: Secondary | ICD-10-CM | POA: Diagnosis not present

## 2016-08-12 DIAGNOSIS — E7800 Pure hypercholesterolemia, unspecified: Secondary | ICD-10-CM | POA: Diagnosis not present

## 2016-08-12 DIAGNOSIS — E785 Hyperlipidemia, unspecified: Secondary | ICD-10-CM | POA: Diagnosis not present

## 2016-08-12 DIAGNOSIS — F0390 Unspecified dementia without behavioral disturbance: Secondary | ICD-10-CM | POA: Diagnosis not present

## 2016-08-12 DIAGNOSIS — S82435D Nondisplaced oblique fracture of shaft of left fibula, subsequent encounter for closed fracture with routine healing: Secondary | ICD-10-CM | POA: Diagnosis not present

## 2016-08-12 DIAGNOSIS — J4520 Mild intermittent asthma, uncomplicated: Secondary | ICD-10-CM | POA: Diagnosis not present

## 2016-08-12 DIAGNOSIS — I10 Essential (primary) hypertension: Secondary | ICD-10-CM | POA: Diagnosis not present

## 2016-08-12 DIAGNOSIS — Z9181 History of falling: Secondary | ICD-10-CM | POA: Diagnosis not present

## 2016-08-17 DIAGNOSIS — F0390 Unspecified dementia without behavioral disturbance: Secondary | ICD-10-CM | POA: Diagnosis not present

## 2016-08-17 DIAGNOSIS — E7800 Pure hypercholesterolemia, unspecified: Secondary | ICD-10-CM | POA: Diagnosis not present

## 2016-08-17 DIAGNOSIS — I10 Essential (primary) hypertension: Secondary | ICD-10-CM | POA: Diagnosis not present

## 2016-08-17 DIAGNOSIS — K219 Gastro-esophageal reflux disease without esophagitis: Secondary | ICD-10-CM | POA: Diagnosis not present

## 2016-08-17 DIAGNOSIS — E785 Hyperlipidemia, unspecified: Secondary | ICD-10-CM | POA: Diagnosis not present

## 2016-08-17 DIAGNOSIS — J4520 Mild intermittent asthma, uncomplicated: Secondary | ICD-10-CM | POA: Diagnosis not present

## 2016-08-17 DIAGNOSIS — Z8673 Personal history of transient ischemic attack (TIA), and cerebral infarction without residual deficits: Secondary | ICD-10-CM | POA: Diagnosis not present

## 2016-08-17 DIAGNOSIS — Z9181 History of falling: Secondary | ICD-10-CM | POA: Diagnosis not present

## 2016-08-17 DIAGNOSIS — S82435D Nondisplaced oblique fracture of shaft of left fibula, subsequent encounter for closed fracture with routine healing: Secondary | ICD-10-CM | POA: Diagnosis not present

## 2016-08-19 DIAGNOSIS — Z9181 History of falling: Secondary | ICD-10-CM | POA: Diagnosis not present

## 2016-08-19 DIAGNOSIS — J4520 Mild intermittent asthma, uncomplicated: Secondary | ICD-10-CM | POA: Diagnosis not present

## 2016-08-19 DIAGNOSIS — I10 Essential (primary) hypertension: Secondary | ICD-10-CM | POA: Diagnosis not present

## 2016-08-19 DIAGNOSIS — Z8673 Personal history of transient ischemic attack (TIA), and cerebral infarction without residual deficits: Secondary | ICD-10-CM | POA: Diagnosis not present

## 2016-08-19 DIAGNOSIS — E7800 Pure hypercholesterolemia, unspecified: Secondary | ICD-10-CM | POA: Diagnosis not present

## 2016-08-19 DIAGNOSIS — K219 Gastro-esophageal reflux disease without esophagitis: Secondary | ICD-10-CM | POA: Diagnosis not present

## 2016-08-19 DIAGNOSIS — S82435D Nondisplaced oblique fracture of shaft of left fibula, subsequent encounter for closed fracture with routine healing: Secondary | ICD-10-CM | POA: Diagnosis not present

## 2016-08-19 DIAGNOSIS — E785 Hyperlipidemia, unspecified: Secondary | ICD-10-CM | POA: Diagnosis not present

## 2016-08-19 DIAGNOSIS — F0390 Unspecified dementia without behavioral disturbance: Secondary | ICD-10-CM | POA: Diagnosis not present

## 2016-08-26 DIAGNOSIS — E785 Hyperlipidemia, unspecified: Secondary | ICD-10-CM | POA: Diagnosis not present

## 2016-08-26 DIAGNOSIS — K219 Gastro-esophageal reflux disease without esophagitis: Secondary | ICD-10-CM | POA: Diagnosis not present

## 2016-08-26 DIAGNOSIS — F0390 Unspecified dementia without behavioral disturbance: Secondary | ICD-10-CM | POA: Diagnosis not present

## 2016-08-26 DIAGNOSIS — S82435D Nondisplaced oblique fracture of shaft of left fibula, subsequent encounter for closed fracture with routine healing: Secondary | ICD-10-CM | POA: Diagnosis not present

## 2016-08-26 DIAGNOSIS — E7800 Pure hypercholesterolemia, unspecified: Secondary | ICD-10-CM | POA: Diagnosis not present

## 2016-08-26 DIAGNOSIS — Z8673 Personal history of transient ischemic attack (TIA), and cerebral infarction without residual deficits: Secondary | ICD-10-CM | POA: Diagnosis not present

## 2016-08-26 DIAGNOSIS — J4520 Mild intermittent asthma, uncomplicated: Secondary | ICD-10-CM | POA: Diagnosis not present

## 2016-08-26 DIAGNOSIS — Z9181 History of falling: Secondary | ICD-10-CM | POA: Diagnosis not present

## 2016-08-26 DIAGNOSIS — I10 Essential (primary) hypertension: Secondary | ICD-10-CM | POA: Diagnosis not present

## 2016-08-28 DIAGNOSIS — Z9181 History of falling: Secondary | ICD-10-CM | POA: Diagnosis not present

## 2016-08-28 DIAGNOSIS — J4520 Mild intermittent asthma, uncomplicated: Secondary | ICD-10-CM | POA: Diagnosis not present

## 2016-08-28 DIAGNOSIS — I10 Essential (primary) hypertension: Secondary | ICD-10-CM | POA: Diagnosis not present

## 2016-08-28 DIAGNOSIS — F0390 Unspecified dementia without behavioral disturbance: Secondary | ICD-10-CM | POA: Diagnosis not present

## 2016-08-28 DIAGNOSIS — S82435D Nondisplaced oblique fracture of shaft of left fibula, subsequent encounter for closed fracture with routine healing: Secondary | ICD-10-CM | POA: Diagnosis not present

## 2016-08-28 DIAGNOSIS — E785 Hyperlipidemia, unspecified: Secondary | ICD-10-CM | POA: Diagnosis not present

## 2016-08-28 DIAGNOSIS — Z8673 Personal history of transient ischemic attack (TIA), and cerebral infarction without residual deficits: Secondary | ICD-10-CM | POA: Diagnosis not present

## 2016-08-28 DIAGNOSIS — E7800 Pure hypercholesterolemia, unspecified: Secondary | ICD-10-CM | POA: Diagnosis not present

## 2016-08-28 DIAGNOSIS — K219 Gastro-esophageal reflux disease without esophagitis: Secondary | ICD-10-CM | POA: Diagnosis not present

## 2016-08-31 DIAGNOSIS — E7800 Pure hypercholesterolemia, unspecified: Secondary | ICD-10-CM | POA: Diagnosis not present

## 2016-08-31 DIAGNOSIS — K219 Gastro-esophageal reflux disease without esophagitis: Secondary | ICD-10-CM | POA: Diagnosis not present

## 2016-08-31 DIAGNOSIS — Z8673 Personal history of transient ischemic attack (TIA), and cerebral infarction without residual deficits: Secondary | ICD-10-CM | POA: Diagnosis not present

## 2016-08-31 DIAGNOSIS — F0390 Unspecified dementia without behavioral disturbance: Secondary | ICD-10-CM | POA: Diagnosis not present

## 2016-08-31 DIAGNOSIS — E785 Hyperlipidemia, unspecified: Secondary | ICD-10-CM | POA: Diagnosis not present

## 2016-08-31 DIAGNOSIS — I10 Essential (primary) hypertension: Secondary | ICD-10-CM | POA: Diagnosis not present

## 2016-08-31 DIAGNOSIS — Z9181 History of falling: Secondary | ICD-10-CM | POA: Diagnosis not present

## 2016-08-31 DIAGNOSIS — S82435D Nondisplaced oblique fracture of shaft of left fibula, subsequent encounter for closed fracture with routine healing: Secondary | ICD-10-CM | POA: Diagnosis not present

## 2016-08-31 DIAGNOSIS — J4520 Mild intermittent asthma, uncomplicated: Secondary | ICD-10-CM | POA: Diagnosis not present

## 2016-09-01 DIAGNOSIS — E785 Hyperlipidemia, unspecified: Secondary | ICD-10-CM | POA: Diagnosis not present

## 2016-09-01 DIAGNOSIS — Z9181 History of falling: Secondary | ICD-10-CM | POA: Diagnosis not present

## 2016-09-01 DIAGNOSIS — J4520 Mild intermittent asthma, uncomplicated: Secondary | ICD-10-CM | POA: Diagnosis not present

## 2016-09-01 DIAGNOSIS — I10 Essential (primary) hypertension: Secondary | ICD-10-CM | POA: Diagnosis not present

## 2016-09-01 DIAGNOSIS — S82435D Nondisplaced oblique fracture of shaft of left fibula, subsequent encounter for closed fracture with routine healing: Secondary | ICD-10-CM | POA: Diagnosis not present

## 2016-09-01 DIAGNOSIS — E7800 Pure hypercholesterolemia, unspecified: Secondary | ICD-10-CM | POA: Diagnosis not present

## 2016-09-01 DIAGNOSIS — F0390 Unspecified dementia without behavioral disturbance: Secondary | ICD-10-CM | POA: Diagnosis not present

## 2016-09-01 DIAGNOSIS — Z8673 Personal history of transient ischemic attack (TIA), and cerebral infarction without residual deficits: Secondary | ICD-10-CM | POA: Diagnosis not present

## 2016-09-01 DIAGNOSIS — K219 Gastro-esophageal reflux disease without esophagitis: Secondary | ICD-10-CM | POA: Diagnosis not present

## 2016-09-07 DIAGNOSIS — F0390 Unspecified dementia without behavioral disturbance: Secondary | ICD-10-CM | POA: Diagnosis not present

## 2016-09-07 DIAGNOSIS — S82435D Nondisplaced oblique fracture of shaft of left fibula, subsequent encounter for closed fracture with routine healing: Secondary | ICD-10-CM | POA: Diagnosis not present

## 2016-09-07 DIAGNOSIS — E785 Hyperlipidemia, unspecified: Secondary | ICD-10-CM | POA: Diagnosis not present

## 2016-09-07 DIAGNOSIS — J4520 Mild intermittent asthma, uncomplicated: Secondary | ICD-10-CM | POA: Diagnosis not present

## 2016-09-07 DIAGNOSIS — I10 Essential (primary) hypertension: Secondary | ICD-10-CM | POA: Diagnosis not present

## 2016-09-07 DIAGNOSIS — Z8673 Personal history of transient ischemic attack (TIA), and cerebral infarction without residual deficits: Secondary | ICD-10-CM | POA: Diagnosis not present

## 2016-09-07 DIAGNOSIS — E7800 Pure hypercholesterolemia, unspecified: Secondary | ICD-10-CM | POA: Diagnosis not present

## 2016-09-07 DIAGNOSIS — Z9181 History of falling: Secondary | ICD-10-CM | POA: Diagnosis not present

## 2016-09-07 DIAGNOSIS — K219 Gastro-esophageal reflux disease without esophagitis: Secondary | ICD-10-CM | POA: Diagnosis not present

## 2016-09-09 DIAGNOSIS — I10 Essential (primary) hypertension: Secondary | ICD-10-CM | POA: Diagnosis not present

## 2016-09-09 DIAGNOSIS — S82435D Nondisplaced oblique fracture of shaft of left fibula, subsequent encounter for closed fracture with routine healing: Secondary | ICD-10-CM | POA: Diagnosis not present

## 2016-09-09 DIAGNOSIS — E785 Hyperlipidemia, unspecified: Secondary | ICD-10-CM | POA: Diagnosis not present

## 2016-09-09 DIAGNOSIS — Z8673 Personal history of transient ischemic attack (TIA), and cerebral infarction without residual deficits: Secondary | ICD-10-CM | POA: Diagnosis not present

## 2016-09-09 DIAGNOSIS — K219 Gastro-esophageal reflux disease without esophagitis: Secondary | ICD-10-CM | POA: Diagnosis not present

## 2016-09-09 DIAGNOSIS — F0390 Unspecified dementia without behavioral disturbance: Secondary | ICD-10-CM | POA: Diagnosis not present

## 2016-09-09 DIAGNOSIS — Z9181 History of falling: Secondary | ICD-10-CM | POA: Diagnosis not present

## 2016-09-09 DIAGNOSIS — E7800 Pure hypercholesterolemia, unspecified: Secondary | ICD-10-CM | POA: Diagnosis not present

## 2016-09-09 DIAGNOSIS — J4520 Mild intermittent asthma, uncomplicated: Secondary | ICD-10-CM | POA: Diagnosis not present

## 2016-09-15 DIAGNOSIS — S82435D Nondisplaced oblique fracture of shaft of left fibula, subsequent encounter for closed fracture with routine healing: Secondary | ICD-10-CM | POA: Diagnosis not present

## 2016-09-15 DIAGNOSIS — Z8673 Personal history of transient ischemic attack (TIA), and cerebral infarction without residual deficits: Secondary | ICD-10-CM | POA: Diagnosis not present

## 2016-09-15 DIAGNOSIS — J4520 Mild intermittent asthma, uncomplicated: Secondary | ICD-10-CM | POA: Diagnosis not present

## 2016-09-15 DIAGNOSIS — Z9181 History of falling: Secondary | ICD-10-CM | POA: Diagnosis not present

## 2016-09-15 DIAGNOSIS — K219 Gastro-esophageal reflux disease without esophagitis: Secondary | ICD-10-CM | POA: Diagnosis not present

## 2016-09-15 DIAGNOSIS — I10 Essential (primary) hypertension: Secondary | ICD-10-CM | POA: Diagnosis not present

## 2016-09-15 DIAGNOSIS — E785 Hyperlipidemia, unspecified: Secondary | ICD-10-CM | POA: Diagnosis not present

## 2016-09-15 DIAGNOSIS — E7800 Pure hypercholesterolemia, unspecified: Secondary | ICD-10-CM | POA: Diagnosis not present

## 2016-09-15 DIAGNOSIS — F0390 Unspecified dementia without behavioral disturbance: Secondary | ICD-10-CM | POA: Diagnosis not present

## 2016-09-17 DIAGNOSIS — E7800 Pure hypercholesterolemia, unspecified: Secondary | ICD-10-CM | POA: Diagnosis not present

## 2016-09-17 DIAGNOSIS — E785 Hyperlipidemia, unspecified: Secondary | ICD-10-CM | POA: Diagnosis not present

## 2016-09-17 DIAGNOSIS — Z9181 History of falling: Secondary | ICD-10-CM | POA: Diagnosis not present

## 2016-09-17 DIAGNOSIS — Z8673 Personal history of transient ischemic attack (TIA), and cerebral infarction without residual deficits: Secondary | ICD-10-CM | POA: Diagnosis not present

## 2016-09-17 DIAGNOSIS — J4520 Mild intermittent asthma, uncomplicated: Secondary | ICD-10-CM | POA: Diagnosis not present

## 2016-09-17 DIAGNOSIS — S82435D Nondisplaced oblique fracture of shaft of left fibula, subsequent encounter for closed fracture with routine healing: Secondary | ICD-10-CM | POA: Diagnosis not present

## 2016-09-17 DIAGNOSIS — I10 Essential (primary) hypertension: Secondary | ICD-10-CM | POA: Diagnosis not present

## 2016-09-17 DIAGNOSIS — F0390 Unspecified dementia without behavioral disturbance: Secondary | ICD-10-CM | POA: Diagnosis not present

## 2016-09-17 DIAGNOSIS — K219 Gastro-esophageal reflux disease without esophagitis: Secondary | ICD-10-CM | POA: Diagnosis not present

## 2016-09-22 DIAGNOSIS — K219 Gastro-esophageal reflux disease without esophagitis: Secondary | ICD-10-CM | POA: Diagnosis not present

## 2016-09-22 DIAGNOSIS — Z8673 Personal history of transient ischemic attack (TIA), and cerebral infarction without residual deficits: Secondary | ICD-10-CM | POA: Diagnosis not present

## 2016-09-22 DIAGNOSIS — E785 Hyperlipidemia, unspecified: Secondary | ICD-10-CM | POA: Diagnosis not present

## 2016-09-22 DIAGNOSIS — F0390 Unspecified dementia without behavioral disturbance: Secondary | ICD-10-CM | POA: Diagnosis not present

## 2016-09-22 DIAGNOSIS — Z9181 History of falling: Secondary | ICD-10-CM | POA: Diagnosis not present

## 2016-09-22 DIAGNOSIS — J4520 Mild intermittent asthma, uncomplicated: Secondary | ICD-10-CM | POA: Diagnosis not present

## 2016-09-22 DIAGNOSIS — I10 Essential (primary) hypertension: Secondary | ICD-10-CM | POA: Diagnosis not present

## 2016-09-22 DIAGNOSIS — S82435D Nondisplaced oblique fracture of shaft of left fibula, subsequent encounter for closed fracture with routine healing: Secondary | ICD-10-CM | POA: Diagnosis not present

## 2016-09-22 DIAGNOSIS — E7800 Pure hypercholesterolemia, unspecified: Secondary | ICD-10-CM | POA: Diagnosis not present

## 2016-09-25 DIAGNOSIS — Z8673 Personal history of transient ischemic attack (TIA), and cerebral infarction without residual deficits: Secondary | ICD-10-CM | POA: Diagnosis not present

## 2016-09-25 DIAGNOSIS — E7800 Pure hypercholesterolemia, unspecified: Secondary | ICD-10-CM | POA: Diagnosis not present

## 2016-09-25 DIAGNOSIS — E785 Hyperlipidemia, unspecified: Secondary | ICD-10-CM | POA: Diagnosis not present

## 2016-09-25 DIAGNOSIS — K219 Gastro-esophageal reflux disease without esophagitis: Secondary | ICD-10-CM | POA: Diagnosis not present

## 2016-09-25 DIAGNOSIS — Z9181 History of falling: Secondary | ICD-10-CM | POA: Diagnosis not present

## 2016-09-25 DIAGNOSIS — I10 Essential (primary) hypertension: Secondary | ICD-10-CM | POA: Diagnosis not present

## 2016-09-25 DIAGNOSIS — F0390 Unspecified dementia without behavioral disturbance: Secondary | ICD-10-CM | POA: Diagnosis not present

## 2016-09-25 DIAGNOSIS — J4520 Mild intermittent asthma, uncomplicated: Secondary | ICD-10-CM | POA: Diagnosis not present

## 2016-09-25 DIAGNOSIS — S82435D Nondisplaced oblique fracture of shaft of left fibula, subsequent encounter for closed fracture with routine healing: Secondary | ICD-10-CM | POA: Diagnosis not present

## 2016-09-30 DIAGNOSIS — Z8673 Personal history of transient ischemic attack (TIA), and cerebral infarction without residual deficits: Secondary | ICD-10-CM | POA: Diagnosis not present

## 2016-09-30 DIAGNOSIS — F0390 Unspecified dementia without behavioral disturbance: Secondary | ICD-10-CM | POA: Diagnosis not present

## 2016-09-30 DIAGNOSIS — K219 Gastro-esophageal reflux disease without esophagitis: Secondary | ICD-10-CM | POA: Diagnosis not present

## 2016-09-30 DIAGNOSIS — E7800 Pure hypercholesterolemia, unspecified: Secondary | ICD-10-CM | POA: Diagnosis not present

## 2016-09-30 DIAGNOSIS — R2689 Other abnormalities of gait and mobility: Secondary | ICD-10-CM | POA: Diagnosis not present

## 2016-09-30 DIAGNOSIS — E785 Hyperlipidemia, unspecified: Secondary | ICD-10-CM | POA: Diagnosis not present

## 2016-09-30 DIAGNOSIS — J4520 Mild intermittent asthma, uncomplicated: Secondary | ICD-10-CM | POA: Diagnosis not present

## 2016-09-30 DIAGNOSIS — Z9181 History of falling: Secondary | ICD-10-CM | POA: Diagnosis not present

## 2016-09-30 DIAGNOSIS — I10 Essential (primary) hypertension: Secondary | ICD-10-CM | POA: Diagnosis not present

## 2016-10-01 DIAGNOSIS — F0390 Unspecified dementia without behavioral disturbance: Secondary | ICD-10-CM | POA: Diagnosis not present

## 2016-10-01 DIAGNOSIS — E785 Hyperlipidemia, unspecified: Secondary | ICD-10-CM | POA: Diagnosis not present

## 2016-10-01 DIAGNOSIS — Z8673 Personal history of transient ischemic attack (TIA), and cerebral infarction without residual deficits: Secondary | ICD-10-CM | POA: Diagnosis not present

## 2016-10-01 DIAGNOSIS — Z9181 History of falling: Secondary | ICD-10-CM | POA: Diagnosis not present

## 2016-10-01 DIAGNOSIS — I10 Essential (primary) hypertension: Secondary | ICD-10-CM | POA: Diagnosis not present

## 2016-10-01 DIAGNOSIS — K219 Gastro-esophageal reflux disease without esophagitis: Secondary | ICD-10-CM | POA: Diagnosis not present

## 2016-10-01 DIAGNOSIS — R2689 Other abnormalities of gait and mobility: Secondary | ICD-10-CM | POA: Diagnosis not present

## 2016-10-01 DIAGNOSIS — E7800 Pure hypercholesterolemia, unspecified: Secondary | ICD-10-CM | POA: Diagnosis not present

## 2016-10-01 DIAGNOSIS — J4520 Mild intermittent asthma, uncomplicated: Secondary | ICD-10-CM | POA: Diagnosis not present

## 2016-10-06 DIAGNOSIS — E7800 Pure hypercholesterolemia, unspecified: Secondary | ICD-10-CM | POA: Diagnosis not present

## 2016-10-06 DIAGNOSIS — Z8673 Personal history of transient ischemic attack (TIA), and cerebral infarction without residual deficits: Secondary | ICD-10-CM | POA: Diagnosis not present

## 2016-10-06 DIAGNOSIS — J4520 Mild intermittent asthma, uncomplicated: Secondary | ICD-10-CM | POA: Diagnosis not present

## 2016-10-06 DIAGNOSIS — K219 Gastro-esophageal reflux disease without esophagitis: Secondary | ICD-10-CM | POA: Diagnosis not present

## 2016-10-06 DIAGNOSIS — F0390 Unspecified dementia without behavioral disturbance: Secondary | ICD-10-CM | POA: Diagnosis not present

## 2016-10-06 DIAGNOSIS — Z9181 History of falling: Secondary | ICD-10-CM | POA: Diagnosis not present

## 2016-10-06 DIAGNOSIS — R2689 Other abnormalities of gait and mobility: Secondary | ICD-10-CM | POA: Diagnosis not present

## 2016-10-06 DIAGNOSIS — E785 Hyperlipidemia, unspecified: Secondary | ICD-10-CM | POA: Diagnosis not present

## 2016-10-06 DIAGNOSIS — I10 Essential (primary) hypertension: Secondary | ICD-10-CM | POA: Diagnosis not present

## 2016-10-07 DIAGNOSIS — N390 Urinary tract infection, site not specified: Secondary | ICD-10-CM | POA: Diagnosis not present

## 2016-10-07 DIAGNOSIS — I10 Essential (primary) hypertension: Secondary | ICD-10-CM | POA: Diagnosis not present

## 2016-10-07 DIAGNOSIS — F0390 Unspecified dementia without behavioral disturbance: Secondary | ICD-10-CM | POA: Diagnosis not present

## 2016-10-07 DIAGNOSIS — B372 Candidiasis of skin and nail: Secondary | ICD-10-CM | POA: Diagnosis not present

## 2016-10-08 DIAGNOSIS — R2689 Other abnormalities of gait and mobility: Secondary | ICD-10-CM | POA: Diagnosis not present

## 2016-10-08 DIAGNOSIS — K219 Gastro-esophageal reflux disease without esophagitis: Secondary | ICD-10-CM | POA: Diagnosis not present

## 2016-10-08 DIAGNOSIS — E7800 Pure hypercholesterolemia, unspecified: Secondary | ICD-10-CM | POA: Diagnosis not present

## 2016-10-08 DIAGNOSIS — Z8673 Personal history of transient ischemic attack (TIA), and cerebral infarction without residual deficits: Secondary | ICD-10-CM | POA: Diagnosis not present

## 2016-10-08 DIAGNOSIS — Z9181 History of falling: Secondary | ICD-10-CM | POA: Diagnosis not present

## 2016-10-08 DIAGNOSIS — E785 Hyperlipidemia, unspecified: Secondary | ICD-10-CM | POA: Diagnosis not present

## 2016-10-08 DIAGNOSIS — I10 Essential (primary) hypertension: Secondary | ICD-10-CM | POA: Diagnosis not present

## 2016-10-08 DIAGNOSIS — J4520 Mild intermittent asthma, uncomplicated: Secondary | ICD-10-CM | POA: Diagnosis not present

## 2016-10-08 DIAGNOSIS — F0390 Unspecified dementia without behavioral disturbance: Secondary | ICD-10-CM | POA: Diagnosis not present

## 2016-10-13 DIAGNOSIS — Z8673 Personal history of transient ischemic attack (TIA), and cerebral infarction without residual deficits: Secondary | ICD-10-CM | POA: Diagnosis not present

## 2016-10-13 DIAGNOSIS — K219 Gastro-esophageal reflux disease without esophagitis: Secondary | ICD-10-CM | POA: Diagnosis not present

## 2016-10-13 DIAGNOSIS — E7800 Pure hypercholesterolemia, unspecified: Secondary | ICD-10-CM | POA: Diagnosis not present

## 2016-10-13 DIAGNOSIS — Z9181 History of falling: Secondary | ICD-10-CM | POA: Diagnosis not present

## 2016-10-13 DIAGNOSIS — F0390 Unspecified dementia without behavioral disturbance: Secondary | ICD-10-CM | POA: Diagnosis not present

## 2016-10-13 DIAGNOSIS — E785 Hyperlipidemia, unspecified: Secondary | ICD-10-CM | POA: Diagnosis not present

## 2016-10-13 DIAGNOSIS — J4520 Mild intermittent asthma, uncomplicated: Secondary | ICD-10-CM | POA: Diagnosis not present

## 2016-10-13 DIAGNOSIS — I10 Essential (primary) hypertension: Secondary | ICD-10-CM | POA: Diagnosis not present

## 2016-10-13 DIAGNOSIS — R2689 Other abnormalities of gait and mobility: Secondary | ICD-10-CM | POA: Diagnosis not present

## 2016-10-16 DIAGNOSIS — Z8673 Personal history of transient ischemic attack (TIA), and cerebral infarction without residual deficits: Secondary | ICD-10-CM | POA: Diagnosis not present

## 2016-10-16 DIAGNOSIS — E785 Hyperlipidemia, unspecified: Secondary | ICD-10-CM | POA: Diagnosis not present

## 2016-10-16 DIAGNOSIS — I10 Essential (primary) hypertension: Secondary | ICD-10-CM | POA: Diagnosis not present

## 2016-10-16 DIAGNOSIS — K219 Gastro-esophageal reflux disease without esophagitis: Secondary | ICD-10-CM | POA: Diagnosis not present

## 2016-10-16 DIAGNOSIS — F0390 Unspecified dementia without behavioral disturbance: Secondary | ICD-10-CM | POA: Diagnosis not present

## 2016-10-16 DIAGNOSIS — E7800 Pure hypercholesterolemia, unspecified: Secondary | ICD-10-CM | POA: Diagnosis not present

## 2016-10-16 DIAGNOSIS — J4520 Mild intermittent asthma, uncomplicated: Secondary | ICD-10-CM | POA: Diagnosis not present

## 2016-10-16 DIAGNOSIS — Z9181 History of falling: Secondary | ICD-10-CM | POA: Diagnosis not present

## 2016-10-16 DIAGNOSIS — R2689 Other abnormalities of gait and mobility: Secondary | ICD-10-CM | POA: Diagnosis not present

## 2016-10-20 DIAGNOSIS — J4520 Mild intermittent asthma, uncomplicated: Secondary | ICD-10-CM | POA: Diagnosis not present

## 2016-10-20 DIAGNOSIS — I10 Essential (primary) hypertension: Secondary | ICD-10-CM | POA: Diagnosis not present

## 2016-10-20 DIAGNOSIS — E7800 Pure hypercholesterolemia, unspecified: Secondary | ICD-10-CM | POA: Diagnosis not present

## 2016-10-20 DIAGNOSIS — K219 Gastro-esophageal reflux disease without esophagitis: Secondary | ICD-10-CM | POA: Diagnosis not present

## 2016-10-20 DIAGNOSIS — R2689 Other abnormalities of gait and mobility: Secondary | ICD-10-CM | POA: Diagnosis not present

## 2016-10-20 DIAGNOSIS — E785 Hyperlipidemia, unspecified: Secondary | ICD-10-CM | POA: Diagnosis not present

## 2016-10-20 DIAGNOSIS — F0390 Unspecified dementia without behavioral disturbance: Secondary | ICD-10-CM | POA: Diagnosis not present

## 2016-10-20 DIAGNOSIS — Z8673 Personal history of transient ischemic attack (TIA), and cerebral infarction without residual deficits: Secondary | ICD-10-CM | POA: Diagnosis not present

## 2016-10-20 DIAGNOSIS — Z9181 History of falling: Secondary | ICD-10-CM | POA: Diagnosis not present

## 2016-10-23 DIAGNOSIS — Z9181 History of falling: Secondary | ICD-10-CM | POA: Diagnosis not present

## 2016-10-23 DIAGNOSIS — E785 Hyperlipidemia, unspecified: Secondary | ICD-10-CM | POA: Diagnosis not present

## 2016-10-23 DIAGNOSIS — Z8673 Personal history of transient ischemic attack (TIA), and cerebral infarction without residual deficits: Secondary | ICD-10-CM | POA: Diagnosis not present

## 2016-10-23 DIAGNOSIS — K219 Gastro-esophageal reflux disease without esophagitis: Secondary | ICD-10-CM | POA: Diagnosis not present

## 2016-10-23 DIAGNOSIS — J4520 Mild intermittent asthma, uncomplicated: Secondary | ICD-10-CM | POA: Diagnosis not present

## 2016-10-23 DIAGNOSIS — R2689 Other abnormalities of gait and mobility: Secondary | ICD-10-CM | POA: Diagnosis not present

## 2016-10-23 DIAGNOSIS — F0390 Unspecified dementia without behavioral disturbance: Secondary | ICD-10-CM | POA: Diagnosis not present

## 2016-10-23 DIAGNOSIS — I10 Essential (primary) hypertension: Secondary | ICD-10-CM | POA: Diagnosis not present

## 2016-10-23 DIAGNOSIS — E7800 Pure hypercholesterolemia, unspecified: Secondary | ICD-10-CM | POA: Diagnosis not present

## 2016-10-28 DIAGNOSIS — F0390 Unspecified dementia without behavioral disturbance: Secondary | ICD-10-CM | POA: Diagnosis not present

## 2016-10-28 DIAGNOSIS — Z8673 Personal history of transient ischemic attack (TIA), and cerebral infarction without residual deficits: Secondary | ICD-10-CM | POA: Diagnosis not present

## 2016-10-28 DIAGNOSIS — J4520 Mild intermittent asthma, uncomplicated: Secondary | ICD-10-CM | POA: Diagnosis not present

## 2016-10-28 DIAGNOSIS — K219 Gastro-esophageal reflux disease without esophagitis: Secondary | ICD-10-CM | POA: Diagnosis not present

## 2016-10-28 DIAGNOSIS — E7800 Pure hypercholesterolemia, unspecified: Secondary | ICD-10-CM | POA: Diagnosis not present

## 2016-10-28 DIAGNOSIS — I10 Essential (primary) hypertension: Secondary | ICD-10-CM | POA: Diagnosis not present

## 2016-10-28 DIAGNOSIS — Z9181 History of falling: Secondary | ICD-10-CM | POA: Diagnosis not present

## 2016-10-28 DIAGNOSIS — R2689 Other abnormalities of gait and mobility: Secondary | ICD-10-CM | POA: Diagnosis not present

## 2016-10-28 DIAGNOSIS — E785 Hyperlipidemia, unspecified: Secondary | ICD-10-CM | POA: Diagnosis not present

## 2016-10-30 DIAGNOSIS — Z8673 Personal history of transient ischemic attack (TIA), and cerebral infarction without residual deficits: Secondary | ICD-10-CM | POA: Diagnosis not present

## 2016-10-30 DIAGNOSIS — K219 Gastro-esophageal reflux disease without esophagitis: Secondary | ICD-10-CM | POA: Diagnosis not present

## 2016-10-30 DIAGNOSIS — E7800 Pure hypercholesterolemia, unspecified: Secondary | ICD-10-CM | POA: Diagnosis not present

## 2016-10-30 DIAGNOSIS — Z9181 History of falling: Secondary | ICD-10-CM | POA: Diagnosis not present

## 2016-10-30 DIAGNOSIS — E785 Hyperlipidemia, unspecified: Secondary | ICD-10-CM | POA: Diagnosis not present

## 2016-10-30 DIAGNOSIS — J4520 Mild intermittent asthma, uncomplicated: Secondary | ICD-10-CM | POA: Diagnosis not present

## 2016-10-30 DIAGNOSIS — R2689 Other abnormalities of gait and mobility: Secondary | ICD-10-CM | POA: Diagnosis not present

## 2016-10-30 DIAGNOSIS — I10 Essential (primary) hypertension: Secondary | ICD-10-CM | POA: Diagnosis not present

## 2016-10-30 DIAGNOSIS — F0390 Unspecified dementia without behavioral disturbance: Secondary | ICD-10-CM | POA: Diagnosis not present

## 2016-11-02 ENCOUNTER — Emergency Department (HOSPITAL_COMMUNITY): Payer: Medicare HMO

## 2016-11-02 ENCOUNTER — Encounter (HOSPITAL_COMMUNITY)
Admission: EM | Disposition: A | Discharge status: Skilled Nursing Facility | Payer: Self-pay | Source: Home / Self Care | Attending: Internal Medicine

## 2016-11-02 ENCOUNTER — Inpatient Hospital Stay (HOSPITAL_COMMUNITY): Payer: Medicare HMO

## 2016-11-02 ENCOUNTER — Inpatient Hospital Stay (HOSPITAL_COMMUNITY)
Admission: EM | Admit: 2016-11-02 | Discharge: 2016-11-05 | DRG: 482 | Disposition: A | Discharge status: Skilled Nursing Facility | Payer: Medicare HMO | Attending: Internal Medicine | Admitting: Internal Medicine

## 2016-11-02 ENCOUNTER — Inpatient Hospital Stay (HOSPITAL_COMMUNITY): Payer: Medicare HMO | Admitting: Anesthesiology

## 2016-11-02 ENCOUNTER — Encounter (HOSPITAL_COMMUNITY): Payer: Self-pay

## 2016-11-02 DIAGNOSIS — F0390 Unspecified dementia without behavioral disturbance: Secondary | ICD-10-CM | POA: Diagnosis not present

## 2016-11-02 DIAGNOSIS — E785 Hyperlipidemia, unspecified: Secondary | ICD-10-CM | POA: Diagnosis present

## 2016-11-02 DIAGNOSIS — Y92009 Unspecified place in unspecified non-institutional (private) residence as the place of occurrence of the external cause: Secondary | ICD-10-CM | POA: Diagnosis not present

## 2016-11-02 DIAGNOSIS — R001 Bradycardia, unspecified: Secondary | ICD-10-CM | POA: Diagnosis present

## 2016-11-02 DIAGNOSIS — S199XXA Unspecified injury of neck, initial encounter: Secondary | ICD-10-CM | POA: Diagnosis not present

## 2016-11-02 DIAGNOSIS — S728X9A Other fracture of unspecified femur, initial encounter for closed fracture: Secondary | ICD-10-CM | POA: Diagnosis not present

## 2016-11-02 DIAGNOSIS — W1830XA Fall on same level, unspecified, initial encounter: Secondary | ICD-10-CM | POA: Diagnosis present

## 2016-11-02 DIAGNOSIS — K219 Gastro-esophageal reflux disease without esophagitis: Secondary | ICD-10-CM | POA: Diagnosis not present

## 2016-11-02 DIAGNOSIS — S72001A Fracture of unspecified part of neck of right femur, initial encounter for closed fracture: Secondary | ICD-10-CM

## 2016-11-02 DIAGNOSIS — Z4689 Encounter for fitting and adjustment of other specified devices: Secondary | ICD-10-CM | POA: Diagnosis not present

## 2016-11-02 DIAGNOSIS — M25551 Pain in right hip: Secondary | ICD-10-CM | POA: Diagnosis present

## 2016-11-02 DIAGNOSIS — E559 Vitamin D deficiency, unspecified: Secondary | ICD-10-CM

## 2016-11-02 DIAGNOSIS — W19XXXD Unspecified fall, subsequent encounter: Secondary | ICD-10-CM | POA: Diagnosis not present

## 2016-11-02 DIAGNOSIS — M80851A Other osteoporosis with current pathological fracture, right femur, initial encounter for fracture: Secondary | ICD-10-CM | POA: Diagnosis not present

## 2016-11-02 DIAGNOSIS — S72141A Displaced intertrochanteric fracture of right femur, initial encounter for closed fracture: Secondary | ICD-10-CM | POA: Diagnosis not present

## 2016-11-02 DIAGNOSIS — Z9181 History of falling: Secondary | ICD-10-CM | POA: Diagnosis not present

## 2016-11-02 DIAGNOSIS — S299XXA Unspecified injury of thorax, initial encounter: Secondary | ICD-10-CM | POA: Diagnosis not present

## 2016-11-02 DIAGNOSIS — E876 Hypokalemia: Secondary | ICD-10-CM | POA: Diagnosis not present

## 2016-11-02 DIAGNOSIS — L8990 Pressure ulcer of unspecified site, unspecified stage: Secondary | ICD-10-CM | POA: Insufficient documentation

## 2016-11-02 DIAGNOSIS — Z87310 Personal history of (healed) osteoporosis fracture: Secondary | ICD-10-CM

## 2016-11-02 DIAGNOSIS — S72101A Unspecified trochanteric fracture of right femur, initial encounter for closed fracture: Secondary | ICD-10-CM | POA: Diagnosis not present

## 2016-11-02 DIAGNOSIS — I10 Essential (primary) hypertension: Secondary | ICD-10-CM | POA: Diagnosis not present

## 2016-11-02 DIAGNOSIS — G40909 Epilepsy, unspecified, not intractable, without status epilepticus: Secondary | ICD-10-CM | POA: Diagnosis not present

## 2016-11-02 DIAGNOSIS — M545 Low back pain: Secondary | ICD-10-CM | POA: Diagnosis not present

## 2016-11-02 DIAGNOSIS — D649 Anemia, unspecified: Secondary | ICD-10-CM | POA: Diagnosis present

## 2016-11-02 DIAGNOSIS — Z885 Allergy status to narcotic agent status: Secondary | ICD-10-CM

## 2016-11-02 DIAGNOSIS — I350 Nonrheumatic aortic (valve) stenosis: Secondary | ICD-10-CM | POA: Diagnosis not present

## 2016-11-02 DIAGNOSIS — Z8781 Personal history of (healed) traumatic fracture: Secondary | ICD-10-CM

## 2016-11-02 DIAGNOSIS — S0181XA Laceration without foreign body of other part of head, initial encounter: Secondary | ICD-10-CM | POA: Diagnosis not present

## 2016-11-02 DIAGNOSIS — W19XXXA Unspecified fall, initial encounter: Secondary | ICD-10-CM

## 2016-11-02 DIAGNOSIS — E7800 Pure hypercholesterolemia, unspecified: Secondary | ICD-10-CM | POA: Diagnosis not present

## 2016-11-02 DIAGNOSIS — S0990XA Unspecified injury of head, initial encounter: Secondary | ICD-10-CM | POA: Diagnosis not present

## 2016-11-02 DIAGNOSIS — Z419 Encounter for procedure for purposes other than remedying health state, unspecified: Secondary | ICD-10-CM

## 2016-11-02 DIAGNOSIS — S72141D Displaced intertrochanteric fracture of right femur, subsequent encounter for closed fracture with routine healing: Secondary | ICD-10-CM | POA: Diagnosis not present

## 2016-11-02 DIAGNOSIS — R4182 Altered mental status, unspecified: Secondary | ICD-10-CM | POA: Diagnosis not present

## 2016-11-02 DIAGNOSIS — S7291XA Unspecified fracture of right femur, initial encounter for closed fracture: Secondary | ICD-10-CM | POA: Diagnosis not present

## 2016-11-02 DIAGNOSIS — J4520 Mild intermittent asthma, uncomplicated: Secondary | ICD-10-CM | POA: Diagnosis not present

## 2016-11-02 DIAGNOSIS — Z8673 Personal history of transient ischemic attack (TIA), and cerebral infarction without residual deficits: Secondary | ICD-10-CM | POA: Diagnosis not present

## 2016-11-02 HISTORY — DX: Atherosclerotic heart disease of native coronary artery without angina pectoris: I25.10

## 2016-11-02 HISTORY — PX: INTRAMEDULLARY (IM) NAIL INTERTROCHANTERIC: SHX5875

## 2016-11-02 HISTORY — DX: Cerebral infarction, unspecified: I63.9

## 2016-11-02 HISTORY — DX: Gastro-esophageal reflux disease without esophagitis: K21.9

## 2016-11-02 HISTORY — DX: Unspecified osteoarthritis, unspecified site: M19.90

## 2016-11-02 LAB — CBC WITH DIFFERENTIAL/PLATELET
BASOS ABS: 0 10*3/uL (ref 0.0–0.1)
BASOS PCT: 0 %
EOS ABS: 0 10*3/uL (ref 0.0–0.7)
Eosinophils Relative: 0 %
HEMATOCRIT: 34.2 % — AB (ref 36.0–46.0)
HEMOGLOBIN: 11.6 g/dL — AB (ref 12.0–15.0)
Lymphocytes Relative: 5 %
Lymphs Abs: 0.7 10*3/uL (ref 0.7–4.0)
MCH: 30.7 pg (ref 26.0–34.0)
MCHC: 33.9 g/dL (ref 30.0–36.0)
MCV: 90.5 fL (ref 78.0–100.0)
MONOS PCT: 6 %
Monocytes Absolute: 0.9 10*3/uL (ref 0.1–1.0)
NEUTROS ABS: 12.7 10*3/uL — AB (ref 1.7–7.7)
NEUTROS PCT: 89 %
Platelets: 311 10*3/uL (ref 150–400)
RBC: 3.78 MIL/uL — AB (ref 3.87–5.11)
RDW: 13.5 % (ref 11.5–15.5)
WBC: 14.3 10*3/uL — AB (ref 4.0–10.5)

## 2016-11-02 LAB — BASIC METABOLIC PANEL
ANION GAP: 8 (ref 5–15)
BUN: 7 mg/dL (ref 6–20)
CALCIUM: 8.8 mg/dL — AB (ref 8.9–10.3)
CO2: 30 mmol/L (ref 22–32)
CREATININE: 0.75 mg/dL (ref 0.44–1.00)
Chloride: 100 mmol/L — ABNORMAL LOW (ref 101–111)
GFR calc non Af Amer: >60 mL/min (ref >60)
Glucose, Bld: 140 mg/dL — ABNORMAL HIGH (ref 65–99)
Potassium: 3 mmol/L — ABNORMAL LOW (ref 3.5–5.1)
SODIUM: 138 mmol/L (ref 135–145)

## 2016-11-02 LAB — URINALYSIS, ROUTINE W REFLEX MICROSCOPIC
Bacteria, UA: NONE SEEN
Bilirubin Urine: NEGATIVE
GLUCOSE, UA: NEGATIVE mg/dL
HGB URINE DIPSTICK: NEGATIVE
Ketones, ur: NEGATIVE mg/dL
Leukocytes, UA: NEGATIVE
Nitrite: NEGATIVE
PH: 5 (ref 5.0–8.0)
PROTEIN: 30 mg/dL — AB
SPECIFIC GRAVITY, URINE: 1.018 (ref 1.005–1.030)

## 2016-11-02 LAB — CK: Total CK: 107 U/L (ref 38–234)

## 2016-11-02 LAB — TYPE AND SCREEN
ABO/RH(D): B POS
ANTIBODY SCREEN: NEGATIVE

## 2016-11-02 LAB — PROTIME-INR
INR: 1.14
PROTHROMBIN TIME: 14.7 s (ref 11.4–15.2)

## 2016-11-02 LAB — ABO/RH: ABO/RH(D): B POS

## 2016-11-02 SURGERY — FIXATION, FRACTURE, INTERTROCHANTERIC, WITH INTRAMEDULLARY ROD
Anesthesia: General | Laterality: Right

## 2016-11-02 MED ORDER — METOCLOPRAMIDE HCL 5 MG PO TABS: 5.0000 mg | ORAL_TABLET | Freq: Three times a day (TID) | ORAL | Status: DC | PRN

## 2016-11-02 MED ORDER — LIDOCAINE HCL (CARDIAC) 20 MG/ML IV SOLN
INTRAVENOUS | Status: DC | PRN
  Administered 2016-11-02: 60 mg via INTRAVENOUS

## 2016-11-02 MED ORDER — FENTANYL CITRATE (PF) 250 MCG/5ML IJ SOLN
INTRAMUSCULAR | Status: AC
  Filled 2016-11-02: qty 5

## 2016-11-02 MED ORDER — DEXAMETHASONE SODIUM PHOSPHATE 4 MG/ML IJ SOLN
INTRAMUSCULAR | Status: DC | PRN
  Administered 2016-11-02: 4 mg via INTRAVENOUS

## 2016-11-02 MED ORDER — ETOMIDATE 2 MG/ML IV SOLN
INTRAVENOUS | Status: AC
  Filled 2016-11-02: qty 10

## 2016-11-02 MED ORDER — CEFAZOLIN SODIUM-DEXTROSE 2-4 GM/100ML-% IV SOLN
2.0000 g | INTRAVENOUS | Status: AC
Start: 2016-11-03 — End: 2016-11-02
  Administered 2016-11-02: 2 g via INTRAVENOUS

## 2016-11-02 MED ORDER — CEFAZOLIN SODIUM-DEXTROSE 1-4 GM/50ML-% IV SOLN
1.0000 g | Freq: Four times a day (QID) | INTRAVENOUS | Status: AC
  Administered 2016-11-02 – 2016-11-03 (×3): 1 g via INTRAVENOUS
  Filled 2016-11-02 (×3): qty 50

## 2016-11-02 MED ORDER — PROPOFOL 10 MG/ML IV BOLUS
INTRAVENOUS | Status: DC | PRN
  Administered 2016-11-02: 10 mg via INTRAVENOUS

## 2016-11-02 MED ORDER — CEFAZOLIN SODIUM-DEXTROSE 2-4 GM/100ML-% IV SOLN
INTRAVENOUS | Status: AC
  Filled 2016-11-02: qty 100

## 2016-11-02 MED ORDER — PHENYLEPHRINE HCL 10 MG/ML IJ SOLN
INTRAMUSCULAR | Status: DC | PRN
  Administered 2016-11-02: 50 ug/min via INTRAVENOUS

## 2016-11-02 MED ORDER — ACETAMINOPHEN 650 MG RE SUPP
650.0000 mg | Freq: Four times a day (QID) | RECTAL | Status: DC | PRN
Start: 2016-11-02 — End: 2016-11-05

## 2016-11-02 MED ORDER — ONDANSETRON HCL 4 MG PO TABS: 4.0000 mg | ORAL_TABLET | Freq: Four times a day (QID) | ORAL | Status: DC | PRN

## 2016-11-02 MED ORDER — MORPHINE SULFATE (PF) 4 MG/ML IV SOLN
4.0000 mg | INTRAVENOUS | Status: DC | PRN
  Administered 2016-11-02: 4 mg via INTRAVENOUS
  Filled 2016-11-02: qty 1

## 2016-11-02 MED ORDER — ONDANSETRON HCL 4 MG/2ML IJ SOLN: 4.0000 mg | Freq: Four times a day (QID) | INTRAMUSCULAR | Status: DC | PRN

## 2016-11-02 MED ORDER — CHLORHEXIDINE GLUCONATE 4 % EX LIQD
60.0000 mL | Freq: Once | CUTANEOUS | Status: DC
  Filled 2016-11-02: qty 15

## 2016-11-02 MED ORDER — OXYCODONE HCL 5 MG PO TABS
5.0000 mg | ORAL_TABLET | ORAL | Status: DC | PRN
  Administered 2016-11-02 – 2016-11-03 (×4): 5 mg via ORAL
  Filled 2016-11-02 (×4): qty 1

## 2016-11-02 MED ORDER — HYDROCODONE-ACETAMINOPHEN 5-325 MG PO TABS
1.0000 | ORAL_TABLET | ORAL | Status: DC | PRN
  Administered 2016-11-05: 1 via ORAL
  Filled 2016-11-02: qty 1

## 2016-11-02 MED ORDER — SODIUM CHLORIDE 0.9 % IV SOLN
INTRAVENOUS | Status: DC
  Administered 2016-11-02 (×2): via INTRAVENOUS

## 2016-11-02 MED ORDER — PANTOPRAZOLE SODIUM 40 MG PO TBEC
40.0000 mg | DELAYED_RELEASE_TABLET | Freq: Every day | ORAL | Status: DC
  Administered 2016-11-03 – 2016-11-05 (×3): 40 mg via ORAL
  Filled 2016-11-02 (×3): qty 1

## 2016-11-02 MED ORDER — PHENYLEPHRINE 40 MCG/ML (10ML) SYRINGE FOR IV PUSH (FOR BLOOD PRESSURE SUPPORT)
PREFILLED_SYRINGE | INTRAVENOUS | Status: AC
  Filled 2016-11-02: qty 10

## 2016-11-02 MED ORDER — LISINOPRIL 5 MG PO TABS
5.0000 mg | ORAL_TABLET | Freq: Every day | ORAL | Status: DC
  Administered 2016-11-02 – 2016-11-04 (×2): 5 mg via ORAL
  Filled 2016-11-02 (×2): qty 1

## 2016-11-02 MED ORDER — ASPIRIN EC 81 MG PO TBEC: 81.0000 mg | DELAYED_RELEASE_TABLET | Freq: Two times a day (BID) | ORAL | 0 refills | Status: AC

## 2016-11-02 MED ORDER — DONEPEZIL HCL 10 MG PO TABS
10.0000 mg | ORAL_TABLET | Freq: Every day | ORAL | Status: DC
Start: 2016-11-02 — End: 2016-11-05
  Administered 2016-11-02 – 2016-11-04 (×3): 10 mg via ORAL
  Filled 2016-11-02 (×3): qty 1

## 2016-11-02 MED ORDER — MONTELUKAST SODIUM 10 MG PO TABS
10.0000 mg | ORAL_TABLET | Freq: Every day | ORAL | Status: DC
Start: 2016-11-02 — End: 2016-11-05
  Administered 2016-11-02 – 2016-11-04 (×3): 10 mg via ORAL
  Filled 2016-11-02 (×3): qty 1

## 2016-11-02 MED ORDER — METOCLOPRAMIDE HCL 5 MG/ML IJ SOLN: 5.0000 mg | Freq: Three times a day (TID) | INTRAMUSCULAR | Status: DC | PRN

## 2016-11-02 MED ORDER — LIDOCAINE HCL (PF) 1 % IJ SOLN
5.0000 mL | Freq: Once | INTRAMUSCULAR | Status: AC
  Administered 2016-11-02: 5 mL

## 2016-11-02 MED ORDER — DOCUSATE SODIUM 100 MG PO CAPS
100.0000 mg | ORAL_CAPSULE | Freq: Two times a day (BID) | ORAL | Status: DC
  Administered 2016-11-02 – 2016-11-04 (×5): 100 mg via ORAL
  Filled 2016-11-02 (×6): qty 1

## 2016-11-02 MED ORDER — MORPHINE SULFATE (PF) 4 MG/ML IV SOLN: 1.0000 mg | INTRAVENOUS | Status: DC | PRN

## 2016-11-02 MED ORDER — SUCCINYLCHOLINE CHLORIDE 20 MG/ML IJ SOLN
INTRAMUSCULAR | Status: DC | PRN
  Administered 2016-11-02: 120 mg via INTRAVENOUS

## 2016-11-02 MED ORDER — DEXAMETHASONE SODIUM PHOSPHATE 10 MG/ML IJ SOLN
INTRAMUSCULAR | Status: AC
  Filled 2016-11-02: qty 1

## 2016-11-02 MED ORDER — POTASSIUM CHLORIDE 10 MEQ/100ML IV SOLN
10.0000 meq | Freq: Once | INTRAVENOUS | Status: AC
  Administered 2016-11-02: 10 meq via INTRAVENOUS
  Filled 2016-11-02: qty 100

## 2016-11-02 MED ORDER — SENNOSIDES-DOCUSATE SODIUM 8.6-50 MG PO TABS
1.0000 | ORAL_TABLET | Freq: Every day | ORAL | Status: DC
  Administered 2016-11-02 – 2016-11-04 (×3): 1 via ORAL
  Filled 2016-11-02 (×3): qty 1

## 2016-11-02 MED ORDER — ACETAMINOPHEN 325 MG PO TABS
650.0000 mg | ORAL_TABLET | Freq: Four times a day (QID) | ORAL | Status: DC | PRN
  Administered 2016-11-04: 650 mg via ORAL
  Filled 2016-11-02: qty 2

## 2016-11-02 MED ORDER — ACETAMINOPHEN 325 MG PO TABS: 650.0000 mg | ORAL_TABLET | Freq: Four times a day (QID) | ORAL | Status: DC | PRN

## 2016-11-02 MED ORDER — METOPROLOL SUCCINATE ER 25 MG PO TB24
25.0000 mg | ORAL_TABLET | Freq: Every day | ORAL | Status: DC
Start: 2016-11-02 — End: 2016-11-05
  Administered 2016-11-02 – 2016-11-04 (×3): 25 mg via ORAL
  Filled 2016-11-02 (×3): qty 1

## 2016-11-02 MED ORDER — TOPIRAMATE 25 MG PO TABS
25.0000 mg | ORAL_TABLET | Freq: Every day | ORAL | Status: DC
  Administered 2016-11-02 – 2016-11-04 (×3): 25 mg via ORAL
  Filled 2016-11-02 (×3): qty 1

## 2016-11-02 MED ORDER — LIDOCAINE HCL (PF) 1 % IJ SOLN
INTRAMUSCULAR | Status: AC
  Administered 2016-11-02: 5 mL
  Filled 2016-11-02: qty 5

## 2016-11-02 MED ORDER — TRAMADOL HCL 50 MG PO TABS: 50.0000 mg | ORAL_TABLET | Freq: Four times a day (QID) | ORAL | 0 refills | Status: AC | PRN

## 2016-11-02 MED ORDER — FENTANYL CITRATE (PF) 100 MCG/2ML IJ SOLN
INTRAMUSCULAR | Status: DC | PRN
  Administered 2016-11-02 (×2): 50 ug via INTRAVENOUS

## 2016-11-02 MED ORDER — ETOMIDATE 2 MG/ML IV SOLN
INTRAVENOUS | Status: DC | PRN
  Administered 2016-11-02: 16 mg via INTRAVENOUS

## 2016-11-02 MED ORDER — ALBUMIN HUMAN 5 % IV SOLN
INTRAVENOUS | Status: DC | PRN
  Administered 2016-11-02: 16:00:00 via INTRAVENOUS

## 2016-11-02 MED ORDER — POLYETHYLENE GLYCOL 3350 17 G PO PACK
17.0000 g | PACK | Freq: Every day | ORAL | Status: DC
  Administered 2016-11-03 – 2016-11-04 (×2): 17 g via ORAL
  Filled 2016-11-02 (×3): qty 1

## 2016-11-02 MED ORDER — 0.9 % SODIUM CHLORIDE (POUR BTL) OPTIME
TOPICAL | Status: DC | PRN
  Administered 2016-11-02: 1000 mL

## 2016-11-02 MED ORDER — KETOTIFEN FUMARATE 0.025 % OP SOLN
1.0000 [drp] | Freq: Two times a day (BID) | OPHTHALMIC | Status: DC
  Administered 2016-11-02 – 2016-11-05 (×6): 1 [drp] via OPHTHALMIC
  Filled 2016-11-02: qty 5

## 2016-11-02 MED ORDER — ONDANSETRON HCL 4 MG/2ML IJ SOLN
INTRAMUSCULAR | Status: DC | PRN
  Administered 2016-11-02: 4 mg via INTRAVENOUS

## 2016-11-02 MED ORDER — METHOCARBAMOL 1000 MG/10ML IJ SOLN
500.0000 mg | Freq: Four times a day (QID) | INTRAVENOUS | Status: DC | PRN
  Filled 2016-11-02: qty 5

## 2016-11-02 MED ORDER — PHENYLEPHRINE HCL 10 MG/ML IJ SOLN
INTRAMUSCULAR | Status: DC | PRN
  Administered 2016-11-02: 80 ug via INTRAVENOUS
  Administered 2016-11-02: 120 ug via INTRAVENOUS

## 2016-11-02 MED ORDER — FENTANYL CITRATE (PF) 100 MCG/2ML IJ SOLN: 25.0000 ug | INTRAMUSCULAR | Status: DC | PRN

## 2016-11-02 MED ORDER — MORPHINE SULFATE (PF) 2 MG/ML IV SOLN: 1.0000 mg | INTRAVENOUS | Status: DC | PRN

## 2016-11-02 MED ORDER — METHOCARBAMOL 500 MG PO TABS
500.0000 mg | ORAL_TABLET | Freq: Four times a day (QID) | ORAL | Status: DC | PRN
  Filled 2016-11-02: qty 1

## 2016-11-02 MED ORDER — MORPHINE SULFATE (PF) 4 MG/ML IV SOLN: 4.0000 mg | INTRAVENOUS | Status: DC | PRN

## 2016-11-02 MED ORDER — POTASSIUM CHLORIDE IN NACL 40-0.9 MEQ/L-% IV SOLN
INTRAVENOUS | Status: AC
  Administered 2016-11-03: 100 mL/h via INTRAVENOUS
  Filled 2016-11-02 (×2): qty 1000

## 2016-11-02 MED ORDER — POTASSIUM CHLORIDE 10 MEQ/100ML IV SOLN
INTRAVENOUS | Status: AC
Start: 2016-11-02 — End: 2016-11-02
  Filled 2016-11-02: qty 100

## 2016-11-02 SURGICAL SUPPLY — 44 items
BIT DRILL FLUTED FEMUR 4.2/3 (BIT) ×3 IMPLANT
BLADE SURG 15 STRL LF DISP TIS (BLADE) ×1 IMPLANT
BLADE SURG 15 STRL SS (BLADE) ×2
BNDG COHESIVE 4X5 TAN NS LF (GAUZE/BANDAGES/DRESSINGS) ×3 IMPLANT
BNDG COHESIVE 6X5 TAN STRL LF (GAUZE/BANDAGES/DRESSINGS) IMPLANT
BNDG GAUZE ELAST 4 BULKY (GAUZE/BANDAGES/DRESSINGS) ×3 IMPLANT
COVER PERINEAL POST (MISCELLANEOUS) ×3 IMPLANT
COVER SURGICAL LIGHT HANDLE (MISCELLANEOUS) ×3 IMPLANT
DRAPE HALF SHEET 40X57 (DRAPES) IMPLANT
DRAPE STERI IOBAN 125X83 (DRAPES) ×3 IMPLANT
DRSG MEPILEX BORDER 4X4 (GAUZE/BANDAGES/DRESSINGS) ×3 IMPLANT
DRSG MEPILEX BORDER 4X8 (GAUZE/BANDAGES/DRESSINGS) ×3 IMPLANT
DRSG PAD ABDOMINAL 8X10 ST (GAUZE/BANDAGES/DRESSINGS) ×6 IMPLANT
DURAPREP 26ML APPLICATOR (WOUND CARE) ×3 IMPLANT
ELECT CAUTERY BLADE 6.4 (BLADE) ×3 IMPLANT
ELECT REM PT RETURN 9FT ADLT (ELECTROSURGICAL) ×3
ELECTRODE REM PT RTRN 9FT ADLT (ELECTROSURGICAL) ×1 IMPLANT
FACESHIELD WRAPAROUND (MASK) ×3 IMPLANT
GAUZE XEROFORM 5X9 LF (GAUZE/BANDAGES/DRESSINGS) ×3 IMPLANT
GLOVE BIO SURGEON STRL SZ7.5 (GLOVE) ×3 IMPLANT
GLOVE BIOGEL PI IND STRL 8 (GLOVE) ×1 IMPLANT
GLOVE BIOGEL PI INDICATOR 8 (GLOVE) ×2
GOWN STRL REUS W/ TWL LRG LVL3 (GOWN DISPOSABLE) ×2 IMPLANT
GOWN STRL REUS W/ TWL XL LVL3 (GOWN DISPOSABLE) ×1 IMPLANT
GOWN STRL REUS W/TWL LRG LVL3 (GOWN DISPOSABLE) ×4
GOWN STRL REUS W/TWL XL LVL3 (GOWN DISPOSABLE) ×2
GUIDEWIRE 3.2X400 (WIRE) ×6 IMPLANT
KIT BASIN OR (CUSTOM PROCEDURE TRAY) ×3 IMPLANT
KIT ROOM TURNOVER OR (KITS) ×3 IMPLANT
LINER BOOT UNIVERSAL DISP (MISCELLANEOUS) ×3 IMPLANT
MANIFOLD NEPTUNE II (INSTRUMENTS) ×3 IMPLANT
NAIL TROCH FIX 10X235 RT 130 (Nail) ×3 IMPLANT
NS IRRIG 1000ML POUR BTL (IV SOLUTION) ×3 IMPLANT
PACK GENERAL/GYN (CUSTOM PROCEDURE TRAY) ×3 IMPLANT
PAD ARMBOARD 7.5X6 YLW CONV (MISCELLANEOUS) ×6 IMPLANT
PAD CAST 4YDX4 CTTN HI CHSV (CAST SUPPLIES) ×2 IMPLANT
PADDING CAST COTTON 4X4 STRL (CAST SUPPLIES) ×4
SCREW LOCKING 5.0X32MM (Screw) ×3 IMPLANT
SCREW TFNA P5MM STERILE (Screw) ×3 IMPLANT
STAPLER VISISTAT 35W (STAPLE) ×3 IMPLANT
SUT MON AB 2-0 CT1 36 (SUTURE) ×3 IMPLANT
TOWEL OR 17X24 6PK STRL BLUE (TOWEL DISPOSABLE) ×3 IMPLANT
TOWEL OR 17X26 10 PK STRL BLUE (TOWEL DISPOSABLE) ×3 IMPLANT
WATER STERILE IRR 1000ML POUR (IV SOLUTION) ×3 IMPLANT

## 2016-11-02 NOTE — Op Note (Signed)
Date of Surgery: 11/02/2016  INDICATIONS: [name] is a 83 y.o.-year-old female who sustained a right hip fracture, intertrochanteric with slight subtrochanteric extension. She sustained an unwitnessed witnessed fall early this morning around 6:30 AM. She has fairly advanced dementia at baseline and was found by her son who is her primary care provider at home. She was unable to stand and ambulate thus he called EMS for [facility] for evaluation and management. Our assessment she was found to have a right intertrochanteric hip fracture. Indicated for surgical intervention given the unstable nature of this and inability for her to bear weight unless it was fixed. The risks and benefits of the procedure discussed with the patient and family prior to the procedure and all questions were answered; consent was obtained.  Specifically we discussed risk of bleeding, infection, damage to neurovascular structures, nonunion, malunion, hardware failure, persistent pain, development of blood clots, perioperative mortality and the risk of anesthesia.  PREOPERATIVE DIAGNOSIS: right peritrochanteric hip fracture   POSTOPERATIVE DIAGNOSIS: Same   PROCEDURE: Treatment of intertrochanteric, pertrochanteric, subtrochanteric fracture with intramedullary implant. CPT [phone]   SURGEON: Kathi DerJason P. Aundria Rudogers, M.D.   ANESTHESIA: general   IV FLUIDS AND URINE: See anesthesia record   ESTIMATED BLOOD LOSS: See anesthesia record  IMPLANTS:  Synthes TFN A 10 mm x 235 mm 95 mm compression screw 32 mm distal interlocking screw  DRAINS: None.   COMPLICATIONS: None.   DESCRIPTION OF PROCEDURE: The patient was brought to the operating room and placed supine on the operating table. The patient's leg had been signed prior to the procedure. The patient had the anesthesia placed by the anesthesiologist. The prep verification and incision time-outs were performed to confirm that this was the  correct patient, site, side and location. The patient had an SCD on the opposite lower extremity. The patient did receive antibiotics prior to the incision and was re-dosed during the procedure as needed at indicated intervals. The patient was positioned on the Hana table with the table in traction and internal rotation to reduce the hip. The well leg was placed in a scissor position and all bony prominences were well-padded. The patient had the lower extremity prepped and draped in the standard surgical fashion. The incision was made 4 finger breadths superior to the greater trochanter. A guide pin was inserted into the tip of the greater trochanter under fluoroscopic guidance. An opening reamer was used to gain access to the femoral canal. The nail length was measured and inserted down the femoral canal to its proper depth. The appropriate version of insertion for the lag screw was found under fluoroscopy. A pin was inserted up the femoral neck through the jig. The length of the lag screw was then measured. The lag screw was inserted as near to center-center in the head as possible. The leg was taken out of traction, then the interdigitating compression screw was used to compress across the fracture. Compression was visualized on serial xrays.   We next turned our attention to placement of the distal interlocking screw. This was placed through the appropriate slot on the jig for the nail. Skin was incised through skin and subcutaneous tissues tissue and IT band fascia. A tonsil was used to dissect down to level of the bone. The triple trocar was placed against the bone. I then drilled across lateral medial cortex. Measured the appropriate screw length of the drill guide. Placed a 32 mm screw under hand had good purchase.  The wound was copiously irrigated with saline and the subcutaneous layer closed with 2.0 vicryl and the skin was reapproximated with staples. The wounds were cleaned and dried a final time  and a sterile dressing was placed. The hip was taken through a range of motion at the end of the case under fluoroscopic imaging to visualize the approach-withdraw phenomenon and confirm implant length in the head. The patient was then awakened from anesthesia and taken to the recovery room in stable condition. All counts were correct at the end of the case.   POSTOPERATIVE PLAN: The patient will be weight bearing as tolerated and will return in 2 weeks for staple removal and the patient will receive DVT prophylaxis based on other medications, activity level, and risk ratio of bleeding to thrombosis.  Our condition will be for twice a day 81 mg aspirin for 1 month for prevention of blood clots.   Patricia Rued, MD [facility] [phone] 4:09 PM

## 2016-11-02 NOTE — ED Triage Notes (Signed)
Pt from home with a fall that happened sometime last night between midnight and 0630. Pt has a 1in lact to front of head and right hip pain.

## 2016-11-02 NOTE — H&P (Signed)
Date: 11/02/2016               Patient Name:  Patricia Daniels MRN: [id number]  DOB: [birth year] Age / Sex: 83 y.o., female   PCP: Jenne PaneLlc, [facility]         Medical Service: Internal Medicine Teaching Service         Attending Physician: Dr. Mikey BussingHoffman, Marthenia RollingErik C, DO    First Contact: Dr. Delma Officerhundi Pager: [phone]  Second Contact: Dr. Dimple Caseyice Pager: [phone]       After Hours (After 5p/  First Contact Pager: [phone]  weekends / holidays): Second Contact Pager: [phone]   Chief Complaint: Fall  History of Present Illness:  Patricia Daniels is a 83 y.o f with pmh of htn, hld who presented following a fall in her home. The patient is slightly demented and history was provided by the patient's son who was at her bedside. The patient is presumed to have fallen sometime between midnight at 6:30am. The patient was found by her son on the floor lying on her side and with a laceration in the middle of her forehead. The patient has been having pain in her right hip. The patient has had a few falls previously. The patient fell 4-5 months ago and broke her ankle and a year ago when she broke her nose. The patient uses a walker at home and gets help around the house. Her son stays with her at night.    Meds:  Current Meds  Medication Sig  . azelastine (OPTIVAR) 0.05 % ophthalmic solution Place 2 drops into both eyes 2 (two) times daily as needed. irritation  . donepezil (ARICEPT) 10 MG tablet Take 10 mg by mouth at bedtime.  Marland Kitchen. lisinopril (PRINIVIL,ZESTRIL) 5 MG tablet Take 2 tablets (10 mg total) by mouth daily. (Patient taking differently: Take 5 mg by mouth at bedtime. )  . metoprolol succinate (TOPROL-XL) 25 MG 24 hr tablet Take 25 mg by mouth at bedtime.  . montelukast (SINGULAIR) 10 MG tablet Take 10 mg by mouth at bedtime.  Marland Kitchen. nystatin cream (MYCOSTATIN) Apply 1 application topically 2 (two) times daily as needed for rash.  . topiramate (TOPAMAX) 25 MG tablet Take 25 mg by mouth at bedtime.      Allergies: Allergies as of 11/02/2016 - Review Complete 11/02/2016  Allergen Reaction Noted  . Codeine Other (See Comments) 02/06/2015   Past Medical History:  Diagnosis Date  . Dementia   . Hypercholesteremia   . Hypertension   . TIA (transient ischemic attack)     Family History:   Social History: never smoked, no drug use, no etoh  Review of Systems: A complete ROS was negative except as per HPI.   Physical Exam: Blood pressure (!) 174/70, pulse 74, resp. rate 20, SpO2 95 %.   Physical Exam  HENT:  Head: Head is with laceration (mid forehead), with right periorbital erythema and with left periorbital erythema.  Dried blood present over the nose and around the infraorbital margins of bilateral eyes  Cardiovascular: Regular rhythm, normal heart sounds and intact distal pulses.   Pulmonary/Chest: Effort normal and breath sounds normal. No respiratory distress. She has no wheezes.  Musculoskeletal: She exhibits tenderness (right hip).       Right hip: She exhibits decreased strength and tenderness.  Mild erythema present over the proximal aspect of the right hip  Neurological: She is disoriented.  Patient not oriented to place or time, but was able to state  her name  Skin: There is erythema (over right hip).    EKG: Borderline t wave abnormalities, accelerated junctional rhythm  CXR: no evidence of acute cardiopulmonary disease  DG Hips Bilat w or w/o pelvis: Acute, moderately angulated right femoral intertrochanteric fracture.  CT cervical spine wo contrast: No cervical spine fracture or abnormal prevertebral soft tissue swelling. Multilevel cervical spondylotic changes minimally changed from piror exam.  CT Head wo contrast No acute intracranial abnormality. Atrophy, chronic microvascular disease. Diffuse degenerative disc and facet disease in the cervical spine. No acute bony abnormality. Small nodule in the left thyroid nodule.   Assessment & Plan by  Problem:  Patricia Daniels is a 83 y.o f with pmh of htn, hld, numerous falls who presented post a fall  Right hip closed intertrochanteric fracture The patient uses a walker at home and has a history of falls. It is likely that she suffered another mechanical fall that caused her to develop a hip fracture. Based on imaging of her hip it was found that she had a right femoral intertrochanteric fracture. The fracture is likely to have been unprovoked as she did not mention any prior lightheadedness or dizziness.  -Based on the revised cardiac risk index she is class 1 risk and only has a 0.4% risk for major cardiac event during surgery -Orthopedic consult for surgical repair -PT/OT postoperatively -DVT prophylaxis with either aspirin or lovenox  HTN Patient's blood pressure during admission was elevate ranging 163-186/79-102. -Will restart home lisinopril 5mg  bid  Dementia -Will continue home donepezil 10mg    Dispo: Admit patient to Inpatient with expected length of stay greater than 2 midnights.  Signed: Lorenso Courier, MD Internal Medicine PGY1 Pager:[phone] 11/02/2016, 1:13 PM

## 2016-11-02 NOTE — Consult Note (Signed)
ORTHOPAEDIC CONSULTATION  REQUESTING PHYSICIAN: Gust Rung, DO  PCP:  [facility]  Chief Complaint: right hip pain  HPI: Patricia Daniels is a 83 y.o. female who complains of ight hip pain followin a fall at home. She does have dementia at baseline and lives with her son. They have care providers that come to the home during the daytime and then her son provides care and the after hours. Her son found her lying on the floor this morning at 6:30. She had a laceration to her forehead and then severe pain in the right hip and groin area. She is unable to stand and walks up to that. EMS brought her to [facility] emergency department where she was found to have a right hip intertrochanteric fracture. Next  Due to her baseline dementia she's not the best historian but does state that she has no other complaints of pain at this time. She denies any numbness or tingling in the right leg.  She is supposed to use the aid of a walker at baseline but typically per her son she forgets to use that.    Past Medical History:  Diagnosis Date  . Dementia   . Hypercholesteremia   . Hypertension   . TIA (transient ischemic attack)    Past Surgical History:  Procedure Laterality Date  . ABDOMINAL HYSTERECTOMY     Social History   Social History  . Marital status: Married    Spouse name: N/A  . Number of children: N/A  . Years of education: N/A   Social History Main Topics  . Smoking status: Never Smoker  . Smokeless tobacco: Never Used  . Alcohol use No  . Drug use: No  . Sexual activity: No   Other Topics Concern  . None   Social History Narrative  . None   History reviewed. No pertinent family history. Allergies  Allergen Reactions  . Codeine Other (See Comments)    Son states it makes her "not feel good"   Prior to Admission medications   Medication Sig Start Date End Date Taking? Authorizing Provider  azelastine (OPTIVAR) 0.05 % ophthalmic solution Place 2 drops  into both eyes 2 (two) times daily as needed. irritation   Yes [provider]  donepezil (ARICEPT) 10 MG tablet Take 10 mg by mouth at bedtime.   Yes [provider]  lisinopril (PRINIVIL,ZESTRIL) 10 MG tablet Take 10 mg by mouth daily.   Yes [provider]  lisinopril (PRINIVIL,ZESTRIL) 5 MG tablet Take 2 tablets (10 mg total) by mouth daily. Patient taking differently: Take 5 mg by mouth at bedtime.  06/11/16  Yes [name], MD  metoprolol succinate (TOPROL-XL) 25 MG 24 hr tablet Take 25 mg by mouth at bedtime. 09/24/16  Yes [provider]  montelukast (SINGULAIR) 10 MG tablet Take 10 mg by mouth at bedtime. 04/26/16  Yes [provider]  nystatin cream (MYCOSTATIN) Apply 1 application topically 2 (two) times daily as needed for rash. 10/28/16  Yes [provider]  topiramate (TOPAMAX) 25 MG tablet Take 25 mg by mouth at bedtime.   Yes [provider]  acetaminophen (TYLENOL) 325 MG tablet Take 2 tablets (650 mg total) by mouth every 6 (six) hours as needed for mild pain (or Fever >/= 101). 06/11/16   [name], MD  enoxaparin (LOVENOX) 40 MG/0.4ML injection Inject 0.4 mLs (40 mg total) into the skin at bedtime. 06/11/16 07/02/16  [name], MD  oxyCODONE 10  MG TABS Take 1 tablet (10 mg total) by mouth every 4 (four) hours as needed for moderate pain. 06/11/16   [name], MD  pantoprazole (PROTONIX) 40 MG tablet Take 40 mg by mouth daily. 05/20/16   [provider]   Dg Chest 1 View  Result Date: 11/02/2016 CLINICAL DATA:  Fall, injury EXAM: CHEST 1 VIEW COMPARISON:  06/08/2016 FINDINGS: Lungs are clear.  No pleural effusion or pneumothorax. The heart is normal in size. IMPRESSION: No evidence of acute cardiopulmonary disease. Electronically Signed   By: Charline Bills M.D.   On: 11/02/2016 11:18   Ct Head Wo Contrast  Result Date: 11/02/2016 CLINICAL DATA:  Fall, back pain EXAM: CT HEAD WITHOUT CONTRAST CT  CERVICAL SPINE WITHOUT CONTRAST TECHNIQUE: Multidetector CT imaging of the head and cervical spine was performed following the standard protocol without intravenous contrast. Multiplanar CT image reconstructions of the cervical spine were also generated. COMPARISON:  02/06/2015 FINDINGS: CT HEAD FINDINGS Brain: There is atrophy and chronic small vessel disease changes. No acute intracranial abnormality. Specifically, no hemorrhage, hydrocephalus, mass lesion, acute infarction, or significant intracranial injury. Vascular: No hyperdense vessel or unexpected calcification. Skull: No acute calvarial abnormality. Sinuses/Orbits: Visualized paranasal sinuses and mastoids clear. Orbital soft tissues unremarkable. Other: None CT CERVICAL SPINE FINDINGS Alignment: Slight anterolisthesis at multiple levels due to facet disease. Skull base and vertebrae: No fracture Soft tissues and spinal canal: Prevertebral soft tissues are normal. No epidural or paraspinal hematoma. Disc levels: Diffuse degenerative disc and facet disease throughout the cervical spine. Upper chest: No acute findings. Other: 1.4 cm nodule in the left thyroid lobe. IMPRESSION: No acute intracranial abnormality.Atrophy, chronic microvascular disease. Diffuse degenerative disc and facet disease in the cervical spine. No acute bony abnormality. Small nodule in the left thyroid nodule. Electronically Signed   By: Charlett Nose M.D.   On: 11/02/2016 11:49   Ct Cervical Spine Wo Contrast  Result Date: 11/02/2016 CLINICAL DATA:  83 year old female with dementia post fall. Initial encounter. EXAM: CT CERVICAL SPINE WITHOUT CONTRAST TECHNIQUE: Multidetector CT imaging of the cervical spine was performed without intravenous contrast. Multiplanar CT image reconstructions were also generated. COMPARISON:  02/06/2015. FINDINGS: Alignment: Similar to prior exam. Skull base and vertebrae: No cervical spine fracture. Soft tissues and spinal canal: No abnormal  prevertebral soft tissue swelling. Disc levels: Degenerative changes C1-2 with transverse ligament hypertrophy. Cervical spondylotic changes with various degrees of spinal stenosis and foraminal narrowing minimally changed from prior exam. Upper chest: Scarring. Other: Heterogeneous thyroid gland with 1.2 cm thyroid nodule unchanged. Vascular calcifications. Remote left cerebellar fracture with encephalomalacia. IMPRESSION: No cervical spine fracture or abnormal prevertebral soft tissue swelling. Multilevel cervical spondylotic changes minimally changed from prior exam. Electronically Signed   By: Lacy Duverney M.D.   On: 11/02/2016 12:08   Dg Hips Bilat W Or Wo Pelvis 3-4 Views  Result Date: 11/02/2016 CLINICAL DATA:  Right hip pain and frontal laceration after falling last night. Initial encounter. EXAM: DG HIP (WITH OR WITHOUT PELVIS) 3-4V BILAT COMPARISON:  AP pelvis 02/06/2015. FINDINGS: The bones are demineralized. There is an acute, mildly comminuted and moderately angulated intertrochanteric right femur fracture. The left proximal femur appears intact. No evidence of dislocation or pelvic fracture. IMPRESSION: Acute, moderately angulated right femoral intertrochanteric fracture. Electronically Signed   By: Carey Bullocks M.D.   On: 11/02/2016 11:19    Positive ROS: All other systems have been reviewed and were otherwise negative with the exception of those mentioned in the HPI  and as above.  Physical Exam: General: Alert, no acute distress, transverse laceration noted to the mid forehead that has been closed and is hemostatic.  Cardiovascular: No pedal edema Respiratory: No cyanosis, no use of accessory musculature GI: No organomegaly, abdomen is soft and non-tender Skin: No lesions in the area of chief complaint Neurologic: Sensation intact distally Psychiatric: Patient is not  competent for consent with normal mood and affect Lymphatic: No axillary or cervical  lymphadenopathy  MUSCULOSKELETAL:  Right lower extremity :  There are no open wounds or obvious signs of trauma about the proximal thigh and femur. She does have some dermatitis and erythema rash around the anterior and lateral thigh especially in the abdominal fold at the groin. There is no obvious drainage or obvious signs of infection to this.  Unable to comply with focal exam she does respond to light touch in the distal leg in the deep and superficial peroneal nerve as well as the tibial nerve distribution. She spontaneously moves the foot and ankle. She has 2+ dorsalis pedis and posterior tibialis pulse.   Assessment: Right hip closed intertrochanteric fracture.   Plan: -I reviewed the risks benefits and indications of the procedure with her son in the emergency department. He did give informed consent to proceed with intramedullary nailing of right hip fracture as her healthcare power of attorney. -The risks, benefits, and alternatives were discussed with the patient. There are risks associated with the surgery including, but not limited to, problems with anesthesia (death), infection, differences in leg length/angulation/rotation, fracture of bones, loosening or failure of implants, malunion, nonunion, hematoma (blood accumulation) which may require surgical drainage, blood clots, pulmonary embolism, nerve injury (foot drop), and blood vessel injury. The patient understands these risks and elects to proceed. -Specifically we also did discuss increased risk of perioperative mortality given her advanced age and the concomitant hip fracture. -Plan will be for patient to the medical team postoperatively for physical and occupational therapy and dispositional planning. She will be weightbearing as tolerated postoperatively and will recommend twice daily aspirin for 1 month for DVT prophylaxis.    Yolonda KidaJason Patrick Rogers, MD Cell [phone]    11/02/2016 12:38 PM

## 2016-11-02 NOTE — Anesthesia Procedure Notes (Addendum)
Procedure Name: Intubation Date/Time: 11/02/2016 3:01 PM Performed by: Romie MinusOCK, Noya Santarelli K Pre-anesthesia Checklist: Patient identified, Emergency Drugs available, Suction available and Patient being monitored Patient Re-evaluated:Patient Re-evaluated prior to induction Oxygen Delivery Method: Circle System Utilized Preoxygenation: Pre-oxygenation with 100% oxygen Induction Type: IV induction, Rapid sequence and Cricoid Pressure applied Laryngoscope Size: Miller and 2 Grade View: Grade I Tube type: Oral Tube size: 7.0 mm Number of attempts: 1 Airway Equipment and Method: Stylet Placement Confirmation: ETT inserted through vocal cords under direct vision,  positive ETCO2 and breath sounds checked- equal and bilateral Secured at: 20 cm Tube secured with: Tape Dental Injury: Teeth and Oropharynx as per pre-operative assessment

## 2016-11-02 NOTE — Anesthesia Postprocedure Evaluation (Signed)
Anesthesia Post Note  Patient: Patricia Daniels  Procedure(s) Performed: Procedure(s) (LRB): INTRAMEDULLARY (IM) NAIL INTERTROCHANTRIC (Right)     Patient location during evaluation: PACU Anesthesia Type: General Level of consciousness: sedated Pain management: pain level controlled Vital Signs Assessment: post-procedure vital signs reviewed and stable Respiratory status: spontaneous breathing and respiratory function stable Cardiovascular status: stable Anesthetic complications: no    Last Vitals:  Vitals:   11/02/16 1645 11/02/16 1700  BP: (!) 186/86 (!) 166/70  Pulse: (!) 118 (!) 120  Resp: (!) 23 (!) 23  Temp:      Last Pain:  Vitals:   11/02/16 1149  PainSc: 7                  [name]

## 2016-11-02 NOTE — ED Provider Notes (Signed)
[facility]-EMERGENCY DEPT Provider Note   CSN: [id number] Arrival date & time: 11/02/16  0932     History   Chief Complaint Chief Complaint  Patient presents with  . Fall    HPI Patricia Daniels is a 83 y.o. female.  HPI Patient presents to the emergency room for evaluation after a fall. Patient lives at home. Her son states she must have had a fall sometime between midnight and 0 6:30 in the morning. Her son states he found her lying on the floor this morning. She had a laceration to her mid forehead. She is also complaining of severe pain in her right hip groin area. She was not able to stand or move her leg at all without any severe pain. EMS was called and they had to transport her to the emergency room. Patient denies any trouble with any chest pain or shortness of breath. Her greatest pain is in her right hip. Past Medical History:  Diagnosis Date  . Dementia   . Hypercholesteremia   . Hypertension   . TIA (transient ischemic attack)     Patient Active Problem List   Diagnosis Date Noted  . Closed right hip fracture (HCC) 11/02/2016  . Bradycardia 06/08/2016  . Fall 06/08/2016  . Closed fibular fracture 06/08/2016  . HTN (hypertension) 06/08/2016  . HLD (hyperlipidemia) 06/08/2016    Past Surgical History:  Procedure Laterality Date  . ABDOMINAL HYSTERECTOMY      OB History    No data available       Home Medications    Prior to Admission medications   Medication Sig Start Date End Date Taking? Authorizing Provider  acetaminophen (TYLENOL) 325 MG tablet Take 2 tablets (650 mg total) by mouth every 6 (six) hours as needed for mild pain (or Fever >/= 101). 06/11/16   [name], MD  cetirizine (ZYRTEC) 10 MG tablet Take 10 mg by mouth daily.    [provider]  donepezil (ARICEPT) 10 MG tablet Take 10 mg by mouth at bedtime.    [provider]  enoxaparin (LOVENOX) 40 MG/0.4ML injection Inject 0.4 mLs (40 mg total) into the skin at bedtime. 06/11/16  07/02/16  [name], MD  lisinopril (PRINIVIL,ZESTRIL) 5 MG tablet Take 2 tablets (10 mg total) by mouth daily. 06/11/16   [name], MD  montelukast (SINGULAIR) 10 MG tablet Take 10 mg by mouth at bedtime. 04/26/16   [provider]  oxyCODONE 10 MG TABS Take 1 tablet (10 mg total) by mouth every 4 (four) hours as needed for moderate pain. 06/11/16   [name], MD  pantoprazole (PROTONIX) 40 MG tablet Take 40 mg by mouth daily. 05/20/16   [provider]  topiramate (TOPAMAX) 25 MG tablet Take 25 mg by mouth at bedtime.    [provider]    Family History History reviewed. No pertinent family history.  Social History Social History  Substance Use Topics  . Smoking status: Never Smoker  . Smokeless tobacco: Never Used  . Alcohol use No     Allergies   Codeine   Review of Systems Review of Systems  All other systems reviewed and are negative.    Physical Exam Updated Vital Signs BP (!) 165/73   Pulse 74   Resp (!) 25   SpO2 95%   Physical Exam  Constitutional:  Elderly, frail  HENT:  Head: Normocephalic.  Right Ear: External ear normal.  Left Ear: External ear normal.  Approximately 1 inch curvilinear laceration  mid forehead, periosteum visible at the base of the wound  Eyes: Conjunctivae are normal. Right eye exhibits no discharge. Left eye exhibits no discharge. No scleral icterus.  Neck: Neck supple. No tracheal deviation present.  Cardiovascular: Normal rate, regular rhythm and intact distal pulses.   Pulmonary/Chest: Effort normal and breath sounds normal. No stridor. No respiratory distress. She has no wheezes. She has no rales.  Abdominal: Soft. Bowel sounds are normal. She exhibits no distension. There is no tenderness. There is no rebound and no guarding.  Musculoskeletal: She exhibits tenderness and deformity. She exhibits no edema.  Patient is tender to palpation both hips, greatest pain is in her right hip. Unable to  move her right hip without severe pain and discomfort. No other deformities noted in the knees, ankles or the rest of the lower extremities  Neurological: She is alert. She has normal strength. No cranial nerve deficit (no facial droop, extraocular movements intact, no slurred speech) or sensory deficit. She exhibits normal muscle tone. She displays no seizure activity. Coordination normal.  Skin: Skin is warm and dry. No rash noted. She is not diaphoretic.  Psychiatric: She has a normal mood and affect.  Nursing note and vitals reviewed.    ED Treatments / Results  Labs (all labs ordered are listed, but only abnormal results are displayed) Labs Reviewed  BASIC METABOLIC PANEL - Abnormal; Notable for the following:       Result Value   Potassium 3.0 (*)    Chloride 100 (*)    Glucose, Bld 140 (*)    Calcium 8.8 (*)    All other components within normal limits  CBC WITH DIFFERENTIAL/PLATELET - Abnormal; Notable for the following:    WBC 14.3 (*)    RBC 3.78 (*)    Hemoglobin 11.6 (*)    HCT 34.2 (*)    Neutro Abs 12.7 (*)    All other components within normal limits  PROTIME-INR  CK  URINALYSIS, ROUTINE W REFLEX MICROSCOPIC  TYPE AND SCREEN  ABO/RH    EKG  EKG Interpretation  Date/Time:  Monday November 02 2016 09:51:42 EDT Ventricular Rate:  86 PR Interval:    QRS Duration: 94 QT Interval:  399 QTC Calculation: 478 R Axis:   74 Text Interpretation:  Accelerated junctional rhy thm Borderline T wave abnormalities Confirmed by Linwood Dibbles [phone]) on 11/02/2016 10:13:25 AM       Radiology Dg Chest 1 View  Result Date: 11/02/2016 CLINICAL DATA:  Fall, injury EXAM: CHEST 1 VIEW COMPARISON:  06/08/2016 FINDINGS: Lungs are clear.  No pleural effusion or pneumothorax. The heart is normal in size. IMPRESSION: No evidence of acute cardiopulmonary disease. Electronically Signed   By: Charline Bills M.D.   On: 11/02/2016 11:18   Ct Head Wo Contrast  Result Date:  11/02/2016 CLINICAL DATA:  Fall, back pain EXAM: CT HEAD WITHOUT CONTRAST CT CERVICAL SPINE WITHOUT CONTRAST TECHNIQUE: Multidetector CT imaging of the head and cervical spine was performed following the standard protocol without intravenous contrast. Multiplanar CT image reconstructions of the cervical spine were also generated. COMPARISON:  02/06/2015 FINDINGS: CT HEAD FINDINGS Brain: There is atrophy and chronic small vessel disease changes. No acute intracranial abnormality. Specifically, no hemorrhage, hydrocephalus, mass lesion, acute infarction, or significant intracranial injury. Vascular: No hyperdense vessel or unexpected calcification. Skull: No acute calvarial abnormality. Sinuses/Orbits: Visualized paranasal sinuses and mastoids clear. Orbital soft tissues unremarkable. Other: None CT CERVICAL SPINE FINDINGS Alignment: Slight anterolisthesis at multiple levels due to facet  disease. Skull base and vertebrae: No fracture Soft tissues and spinal canal: Prevertebral soft tissues are normal. No epidural or paraspinal hematoma. Disc levels: Diffuse degenerative disc and facet disease throughout the cervical spine. Upper chest: No acute findings. Other: 1.4 cm nodule in the left thyroid lobe. IMPRESSION: No acute intracranial abnormality.Atrophy, chronic microvascular disease. Diffuse degenerative disc and facet disease in the cervical spine. No acute bony abnormality. Small nodule in the left thyroid nodule. Electronically Signed   By: Charlett Nose M.D.   On: 11/02/2016 11:49   Dg Hips Bilat W Or Wo Pelvis 3-4 Views  Result Date: 11/02/2016 CLINICAL DATA:  Right hip pain and frontal laceration after falling last night. Initial encounter. EXAM: DG HIP (WITH OR WITHOUT PELVIS) 3-4V BILAT COMPARISON:  AP pelvis 02/06/2015. FINDINGS: The bones are demineralized. There is an acute, mildly comminuted and moderately angulated intertrochanteric right femur fracture. The left proximal femur appears intact. No  evidence of dislocation or pelvic fracture. IMPRESSION: Acute, moderately angulated right femoral intertrochanteric fracture. Electronically Signed   By: Carey Bullocks M.D.   On: 11/02/2016 11:19    Procedures .Marland KitchenLaceration Repair Date/Time: 11/02/2016 11:57 AM Performed by: Linwood Dibbles Authorized by: Linwood Dibbles   Consent:    Consent obtained:  Verbal   Consent given by:  Patient   Risks discussed:  Infection, need for additional repair, pain, poor cosmetic result and poor wound healing   Alternatives discussed:  No treatment and delayed treatment Anesthesia (see MAR for exact dosages):    Anesthesia method:  Local infiltration   Local anesthetic:  Lidocaine 1% w/o epi Laceration details:    Location: Forehead.   Length (cm):  2.5 Repair type:    Repair type:  Simple Exploration:    Wound exploration: entire depth of wound probed and visualized     Wound extent: no foreign bodies/material noted, no nerve damage noted, no tendon damage noted, no underlying fracture noted and no vascular damage noted     Contaminated: no   Treatment:    Area cleansed with:  Shur-Clens   Amount of cleaning:  Standard   Irrigation method:  Syringe   Visualized foreign bodies/material removed: no   Skin repair:    Repair method:  Sutures   Suture size:  5-0   Suture material:  Nylon   Number of sutures:  5 Approximation:    Approximation:  Close Post-procedure details:    Patient tolerance of procedure:  Tolerated well, no immediate complications    (including critical care time)  Medications Ordered in ED Medications  morphine 4 MG/ML injection 4 mg (4 mg Intravenous Given 11/02/16 1049)  0.9 %  sodium chloride infusion ( Intravenous New Bag/Given 11/02/16 1037)  lidocaine (PF) (XYLOCAINE) 1 % injection 5 mL (5 mLs Infiltration Given 11/02/16 0954)     Initial Impression / Assessment and Plan / ED Course  I have reviewed the triage vital signs and the nursing notes.  Pertinent labs &  imaging results that were available during my care of the patient were reviewed by me and considered in my medical decision making (see chart for details).   patient presented to the emergency room after a fall at home. Initial laboratory and x-rays are notable for a right intertrochanteric hip fracture. CT scan of the C-spine is pending but the CT scan of the head does not show any acute abnormalities. Patient otherwise appears medically stable. I will consult with medical service for admission and consult with the orthopedic service  regarding her hip fracture  Final Clinical Impressions(s) / ED Diagnoses   Final diagnoses:  Closed displaced intertrochanteric fracture of right femur, initial encounter (HCC)  Hypokalemia  Fall, initial encounter      Linwood DibblesKnapp, Dietrick Barris, MD 11/02/16 1202

## 2016-11-02 NOTE — Discharge Instructions (Signed)
-  Okay for full weightbearing as tolerated to the right lower extremity -Maintain postoperative dressing unless it become saturated and may be changed with a clean dry dressing daily. -For prevention of blood clots take one 81 mg aspirin twice daily for 1 month. -Keep wounds clean and dry until follow-up appointment in 2 weeks with her surgeon.

## 2016-11-02 NOTE — Transfer of Care (Signed)
Immediate Anesthesia Transfer of Care Note  Patient: Patricia Daniels  Procedure(s) Performed: Procedure(s): INTRAMEDULLARY (IM) NAIL INTERTROCHANTRIC (Right)  Patient Location: PACU  Anesthesia Type:General  Level of Consciousness: awake, oriented and patient cooperative  Airway & Oxygen Therapy: Patient Spontanous Breathing and Patient connected to face mask oxygen  Post-op Assessment: Report given to RN and Post -op Vital signs reviewed and stable  Post vital signs: Reviewed  Last Vitals:  Vitals:   11/02/16 1215 11/02/16 1230  BP: (!) 183/60 (!) 174/70  Pulse: 71 74  Resp: 20 20    Last Pain:  Vitals:   11/02/16 1149  PainSc: 7          Complications: No apparent anesthesia complications

## 2016-11-02 NOTE — Anesthesia Preprocedure Evaluation (Addendum)
Anesthesia Evaluation  Patient identified by MRN, date of birth, ID band Patient confused    Reviewed: Allergy & Precautions, NPO status , Patient's Chart, lab work & pertinent test results, Unable to perform ROS - Chart review only  History of Anesthesia Complications (+) history of anesthetic complications  Airway Mallampati: II  TM Distance: <3 FB Neck ROM: Limited    Dental  (+) Poor Dentition, Missing, Dental Advisory Given   Pulmonary    breath sounds clear to auscultation       Cardiovascular hypertension, Pt. on medications and Pt. on home beta blockers + Cardiac Stents   Rhythm:Regular Rate:Normal + Systolic murmurs    Neuro/Psych TIA   GI/Hepatic negative GI ROS, Neg liver ROS,   Endo/Other  negative endocrine ROS  Renal/GU negative Renal ROS     Musculoskeletal   Abdominal   Peds  Hematology  (+) anemia ,   Anesthesia Other Findings   Reproductive/Obstetrics                           Anesthesia Physical Anesthesia Plan  ASA: III  Anesthesia Plan: General   Post-op Pain Management:    Induction: Intravenous  PONV Risk Score and Plan: 2 and Ondansetron, Dexamethasone and Treatment may vary due to age or medical condition  Airway Management Planned:   Additional Equipment:   Intra-op Plan:   Post-operative Plan: Extubation in OR and Possible Post-op intubation/ventilation  Informed Consent: I have reviewed the patients History and Physical, chart, labs and discussed the procedure including the risks, benefits and alternatives for the proposed anesthesia with the patient or authorized representative who has indicated his/her understanding and acceptance.   Dental advisory given  Plan Discussed with: CRNA  Anesthesia Plan Comments: (Discussed c son)       Anesthesia Quick Evaluation

## 2016-11-02 NOTE — ED Notes (Signed)
Patient transported to X-ray 

## 2016-11-02 NOTE — Brief Op Note (Signed)
11/02/2016  4:03 PM  PATIENT:  Patricia Daniels  83 y.o. female  PRE-OPERATIVE DIAGNOSIS:  Right hip fracture  POST-OPERATIVE DIAGNOSIS:  Right hip fracture  PROCEDURE:  Procedure(s): INTRAMEDULLARY (IM) NAIL INTERTROCHANTRIC (Right)  SURGEON:  Surgeon(s) and Role:    * Yolonda Kidaogers, Tawfiq Favila Patrick, MD - Primary  PHYSICIAN ASSISTANT:   ASSISTANTS: none   ANESTHESIA:   general  EBL:  Total I/O In: 810 [I.V.:560; IV Piggyback:250] Out: 100 [Blood:100]  BLOOD ADMINISTERED:none  DRAINS: none   LOCAL MEDICATIONS USED:  NONE  SPECIMEN:  No Specimen  DISPOSITION OF SPECIMEN:  N/A  COUNTS:  YES  TOURNIQUET:  * No tourniquets in log *  DICTATION: .Note written in EPIC  PLAN OF CARE: Admit to inpatient   PATIENT DISPOSITION:  PACU - hemodynamically stable.   Delay start of Pharmacological VTE agent (>24hrs) due to surgical blood loss or risk of bleeding: not applicable

## 2016-11-03 ENCOUNTER — Encounter (HOSPITAL_COMMUNITY): Payer: Self-pay | Admitting: Orthopedic Surgery

## 2016-11-03 DIAGNOSIS — S72141A Displaced intertrochanteric fracture of right femur, initial encounter for closed fracture: Secondary | ICD-10-CM

## 2016-11-03 LAB — BASIC METABOLIC PANEL
ANION GAP: 7 (ref 5–15)
BUN: 9 mg/dL (ref 6–20)
CHLORIDE: 105 mmol/L (ref 101–111)
CO2: 27 mmol/L (ref 22–32)
CREATININE: 0.91 mg/dL (ref 0.44–1.00)
Calcium: 8.4 mg/dL — ABNORMAL LOW (ref 8.9–10.3)
GFR calc non Af Amer: 52 mL/min — ABNORMAL LOW (ref >60)
Glucose, Bld: 121 mg/dL — ABNORMAL HIGH (ref 65–99)
POTASSIUM: 3.4 mmol/L — AB (ref 3.5–5.1)
SODIUM: 139 mmol/L (ref 135–145)

## 2016-11-03 LAB — CBC
HCT: 28.5 % — ABNORMAL LOW (ref 36.0–46.0)
HEMOGLOBIN: 9.4 g/dL — AB (ref 12.0–15.0)
MCH: 30.8 pg (ref 26.0–34.0)
MCHC: 33 g/dL (ref 30.0–36.0)
MCV: 93.4 fL (ref 78.0–100.0)
Platelets: 240 10*3/uL (ref 150–400)
RBC: 3.05 MIL/uL — AB (ref 3.87–5.11)
RDW: 14.5 % (ref 11.5–15.5)
WBC: 9 10*3/uL (ref 4.0–10.5)

## 2016-11-03 MED ORDER — ENSURE ENLIVE PO LIQD
237.0000 mL | Freq: Two times a day (BID) | ORAL | Status: DC
  Administered 2016-11-04: 237 mL via ORAL

## 2016-11-03 NOTE — Progress Notes (Signed)
   Subjective: Patricia Daniels was seen sitting in chair eating her breakfast this morning. She stated that she is doing well. She noted some slight pain in her right hip. She denies any sob or chest pain.   Objective:  Vital signs in last 24 hours: Vitals:   11/02/16 1853 11/02/16 2031 11/03/16 0037 11/03/16 0503  BP: (!) 184/85 (!) 156/87 (!) 142/54 (!) 144/55  Pulse: (!) 118 100 73 72  Resp:  18 18 18   Temp:  98.6 F (37 C) 98.1 F (36.7 C) (!) 97.4 F (36.3 C)  TempSrc:  Oral Oral Oral  SpO2:  94% 96% 93%  Weight:      Height:       Physical Exam  Constitutional: She appears well-developed and well-nourished. No distress.  HENT:  Head: Normocephalic and atraumatic.  Eyes: Conjunctivae are normal.  Cardiovascular: Normal rate, regular rhythm, normal heart sounds and intact distal pulses.   Pulmonary/Chest: Effort normal and breath sounds normal. No respiratory distress. She has no wheezes.  Musculoskeletal: She exhibits tenderness.       Right hip: She exhibits decreased range of motion and decreased strength.   Assessment/Plan: Right hip closed intertrochanteric fracture The patient's fracture was likely due to a mechanical fall. The patient's dementia is likely causing her to not be using her walker regularly and putting her at more risk of trauma and falls. The patient got a intramedullary implant to treat her intertrochanteric, peritrochanteric, and subtrochanteric fracture.  -OT made recommendation to discharge to snf -Social work consult was made to find nursing home placement -Will talk to son regarding he would like to consider snf or home health -Ordered vitamin d level  HTN Range of blood pressure over the past 24hrs has been 142-186/54-86. Continue patient's home blood pressure medication of lisinopril 5mg  qd and metoprolol succinate 25mg  qd.  Dementia Continue donepezil  Dispo: Anticipated discharge in approximately 1-2 day(s).   Lorenso CourierVahini Swayzee Wadley, MD Internal  Medicine PGY1 [phone] 11/03/2016, 11:16 AM

## 2016-11-03 NOTE — Plan of Care (Signed)
Problem: Safety: Goal: Ability to remain free from injury will improve Outcome: Progressing Patient remains free of falls during this admission. Call bell within reach. Bed in low and locked position. Bed alarm is on. Clean and clear environment maintained. Nonskid footwear being utilized. 3/4 siderails in place. Patient verbalized understanding of safety instruction.  Problem: Pain Management: Goal: Pain level will decrease with appropriate interventions Outcome: Progressing Pain being managed with PO PRN pain medication at this time.

## 2016-11-03 NOTE — Progress Notes (Signed)
   Subjective:  Patient reports pain as mild.  Somewhat confused this am.  Objective:   VITALS:   Vitals:   11/02/16 1853 11/02/16 2031 11/03/16 0037 11/03/16 0503  BP: (!) 184/85 (!) 156/87 (!) 142/54 (!) 144/55  Pulse: (!) 118 100 73 72  Resp:  18 18 18   Temp:  98.6 F (37 C) 98.1 F (36.7 C) (!) 97.4 F (36.3 C)  TempSrc:  Oral Oral Oral  SpO2:  94% 96% 93%  Weight:      Height:        RLE- Dressings c/d/i, no drainage Endorses SILT dp/sp/sur/saph, by responding to toiuch Spontaneously moves TA/GSC/FHL/EHL 2+ DP  Lab Results  Component Value Date   WBC 9.0 11/03/2016   HGB 9.4 (L) 11/03/2016   HCT 28.5 (L) 11/03/2016   MCV 93.4 11/03/2016   PLT 240 11/03/2016   BMET    Component Value Date/Time   NA 139 11/03/2016 0427   NA 142 04/24/2011 1142   K 3.4 (L) 11/03/2016 0427   K 4.0 04/24/2011 1142   CL 105 11/03/2016 0427   CL 108 (H) 04/24/2011 1142   CO2 27 11/03/2016 0427   CO2 26 04/24/2011 1142   GLUCOSE 121 (H) 11/03/2016 0427   GLUCOSE 99 04/24/2011 1142   BUN 9 11/03/2016 0427   BUN 20 (H) 04/24/2011 1142   CREATININE 0.91 11/03/2016 0427   CREATININE 0.91 04/24/2011 1142   CALCIUM 8.4 (L) 11/03/2016 0427   CALCIUM 9.0 04/24/2011 1142   GFRNONAA 52 (L) 11/03/2016 0427   GFRNONAA >60 04/24/2011 1142   GFRAA >60 11/03/2016 0427   GFRAA >60 04/24/2011 1142     Assessment/Plan: 1 Day Post-Op   Principal Problem:   Closed right hip fracture (HCC) Active Problems:   Dementia   Advance diet Up with therapy WBAT RLE Bid 81 mg asa with SCDs for DVT ppx   Yolonda KidaJason Patrick Rogers 11/03/2016, 7:34 AM   Maryan RuedJason P Rogers, MD [phone]

## 2016-11-03 NOTE — Social Work (Signed)
CSW called Verlon AuLeslie at [facility] to see if they can make a bed offer as this is the families preference for SNF. Verlon AuLeslie advised that they will review and follow-up with CSW.  CSW advised that clinicals would need to be get [facility]ed to [facility] to [employer] Insurance Auth as well.  CSW will continue to follow-up.  Keene BreathPatricia Raihana Balderrama, LCSW Clinical Social Worker [phone]

## 2016-11-03 NOTE — Clinical Social Work Note (Addendum)
Clinical Social Work Assessment  Patient Details  Name: Patricia Daniels MRN: [id number] Date of Birth: 04-10-1920  Date of referral:  11/03/16               Reason for consult:  Facility Placement                Permission sought to share information with:    Permission granted to share information::  Yes, Verbal Permission Granted  Name::     son, Mr. Orson AloeHenderson  Agency::  SNF  Relationship::     Contact Information:     Housing/Transportation Living arrangements for the past 2 months:  Single Family Home Source of Information:  Patient Patient Interpreter Needed:  None Criminal Activity/Legal Involvement Pertinent to Current Situation/Hospitalization:  No - Comment as needed Significant Relationships:  Adult Children Lives with:  Adult Children Do you feel safe going back to the place where you live?  No Need for family participation in patient care:  No (Coment)  Care giving concerns:  CSW spoke with son about patient as she is only oriented to person. Son indicated that he would like for patient to return to [facility] in [place] as she is not safe to return home at this time.  Social Worker assessment / plan: CSW discussed clinical teams recommendation for DC to SNF for short term rehab.  CSW explained her role and SNF options/placement.  CSW obtained permission to send of SNF offers to [place] area as that is close to son and daughter.  CSW discussed the insurance auth process and advised that we would have to get offers first, select a SNF then send off for insurance auth. Son in agreement and indicated his familiarity with process with insurance. CSW will f/u.  FL2 complete. Passr confirmed. Offers sent.  Employment status:  Retired Database administratornsurance information:  Managed Medicare PT Recommendations:  Skilled Nursing Facility Information / Referral to community resources:  Skilled Nursing Facility  Patient/Family's Response to care:  Son appreciative of CSW assistance with  placement. No issues reported with care at this time.  Patient/Family's Understanding of and Emotional Response to Diagnosis, Current Treatment, and Prognosis:  Family has good understanding of diagnosis, current treatment and prognosis. No issues reported at this time. Son is hopeful that impairment will be addressed in short term rehab.  Emotional Assessment Appearance:  Appears stated age Attitude/Demeanor/Rapport:   (Cooperative) Affect (typically observed):  Accepting, Appropriate Orientation:  Oriented to Self Alcohol / Substance use:  Not Applicable Psych involvement (Current and /or in the community):  No (Comment)  Discharge Needs  Concerns to be addressed:  Care Coordination Readmission within the last 30 days:  No Current discharge risk:  Dependent with Mobility, Physical Impairment Barriers to Discharge:  No Barriers Identified   Tresa Mooreatricia V Sia Gabrielsen, LCSW 11/03/2016, 4:01 PM

## 2016-11-03 NOTE — Evaluation (Signed)
Occupational Therapy Evaluation Patient Details Name: Patricia Daniels MRN: [id number] DOB: Mar 06, 1920 Today's Date: 11/03/2016    History of Present Illness 83 yo female s/p fall with IM R hip WBAT  Past Medical History:  Diagnosis Date  . Dementia   . Hypercholesteremia   . Hypertension   . TIA (transient ischemic attack)       Clinical Impression   Patient is s/p IM nail R femur surgery resulting in functional limitations due to the deficits listed below (see OT problem list). PTA was using a RW and son living within the home with patient.  Patient will benefit from skilled OT acutely to increase independence and safety with ADLS to allow discharge SNF.     Follow Up Recommendations  SNF;Supervision/Assistance - 24 hour (if family can give 24/7 then could consider home)    Equipment Recommendations  3 in 1 bedside commode    Recommendations for Other Services       Precautions / Restrictions Precautions Precautions: Fall Restrictions Weight Bearing Restrictions: No      Mobility Bed Mobility Overal bed mobility: Needs Assistance Bed Mobility: Supine to Sit     Supine to sit: +2 for physical assistance;Max assist     General bed mobility comments: Pt with helicopter method with pad turned to R side of the bed   Transfers Overall transfer level: Needs assistance Equipment used: 1 person hand held assist Transfers: Stand Pivot Transfers;Sit to/from Stand Sit to Stand: Max assist Stand pivot transfers: +2 physical assistance;Max assist       General transfer comment: Pt transfered to the L with no stepping    Balance Overall balance assessment: Needs assistance Sitting-balance support: Bilateral upper extremity supported;Feet supported Sitting balance-Leahy Scale: Poor     Standing balance support: Bilateral upper extremity supported;During functional activity Standing balance-Leahy Scale: Zero                             ADL either  performed or assessed with clinical judgement   ADL Overall ADL's : Needs assistance/impaired Eating/Feeding: Minimal assistance Eating/Feeding Details (indicate cue type and reason): requires all food prep for patient to self feed Grooming: Wash/dry face;Wash/dry hands;Minimal assistance;Sitting Grooming Details (indicate cue type and reason): pt perseverating with face hygiene     Lower Body Bathing: Total assistance Lower Body Bathing Details (indicate cue type and reason): pt with redness and skin irritation. pt with a white discharge question yeast infection based on the discharge texture                       General ADL Comments: pt completed oob to chair this session for breakfast     Vision         Perception     Praxis      Pertinent Vitals/Pain Pain Assessment: Faces Faces Pain Scale: Hurts even more Pain Location: R knee Pain Descriptors / Indicators: Grimacing;Operative site guarding Pain Intervention(s): Monitored during session;Premedicated before session;Repositioned;Ice applied     Hand Dominance Right   Extremity/Trunk Assessment Upper Extremity Assessment Upper Extremity Assessment: Generalized weakness   Lower Extremity Assessment Lower Extremity Assessment: Generalized weakness   Cervical / Trunk Assessment Cervical / Trunk Assessment: Kyphotic   Communication Communication Communication: No difficulties   Cognition Arousal/Alertness: Awake/alert Behavior During Therapy: WFL for tasks assessed/performed Overall Cognitive Status: History of cognitive impairments - at baseline  General Comments: pt very pleasant and thanking staff for any assistance   General Comments  redness in peri area anterior and posterior    Exercises     Shoulder Instructions      Home Living Family/patient expects to be discharged to:: Skilled nursing facility                                         Prior Functioning/Environment Level of Independence: Independent with assistive device(s)        Comments: walked with RW prior to admission        OT Problem List: Decreased strength;Decreased activity tolerance;Impaired balance (sitting and/or standing);Decreased safety awareness;Decreased knowledge of use of DME or AE;Decreased knowledge of precautions;Pain;Decreased cognition      OT Treatment/Interventions: Self-care/ADL training;Therapeutic exercise;DME and/or AE instruction;Therapeutic activities;Cognitive remediation/compensation;Patient/family education;Balance training    OT Goals(Current goals can be found in the care plan section) Acute Rehab OT Goals Patient Stated Goal: none stated OT Goal Formulation: Patient unable to participate in goal setting Time For Goal Achievement: 11/24/16 Potential to Achieve Goals: Good  OT Frequency: Min 2X/week   Barriers to D/C:            Co-evaluation              AM-PAC PT "6 Clicks" Daily Activity     Outcome Measure Help from another person eating meals?: A Little Help from another person taking care of personal grooming?: A Lot Help from another person toileting, which includes using toliet, bedpan, or urinal?: A Lot Help from another person bathing (including washing, rinsing, drying)?: A Lot Help from another person to put on and taking off regular upper body clothing?: A Lot Help from another person to put on and taking off regular lower body clothing?: Total 6 Click Score: 12   End of Session Equipment Utilized During Treatment: Gait belt Nurse Communication: Mobility status;Precautions  Activity Tolerance: Patient tolerated treatment well Patient left: in chair;with call bell/phone within reach;with chair alarm set  OT Visit Diagnosis: Unsteadiness on feet (R26.81)                Time: [id number] OT Time Calculation (min): 18 min Charges:  OT General Charges $OT Visit: 1 Procedure OT  Evaluation $OT Eval Moderate Complexity: 1 Procedure G-Codes:      Mateo FlowJones, Brynn   OTR/L Pager: [phone] Office: [phone] .   Boone MasterJones, Camaryn Lumbert B 11/03/2016, 8:34 AM

## 2016-11-03 NOTE — [place] FL2 (Signed)
[place] MEDICAID FL2 LEVEL OF CARE SCREENING TOOL     IDENTIFICATION  Patient Name: Patricia Daniels Birthdate: 16-Jan-1920 Sex: female Admission Date (Current Location): 11/02/2016  [facility] and [place] Number:  Producer, television/film/video and Address:  [place], [address], [place], [place] [address]      Provider Number: [id number]  Attending Physician Name and Address:  Gust Rung, DO  Relative Name and Phone Number:  Son,     Current Level of Care: Hospital Recommended Level of Care: Skilled Nursing Facility Prior Approval Number:    Date Approved/Denied: 11/03/16 PASRR Number: [id number]  Discharge Plan: SNF    Current Diagnoses: Patient Active Problem List   Diagnosis Date Noted  . Closed displaced intertrochanteric fracture of right femur (HCC) 11/02/2016  . Dementia 11/02/2016  . Bradycardia 06/08/2016  . Fall 06/08/2016  . Closed fibular fracture 06/08/2016  . HTN (hypertension) 06/08/2016  . HLD (hyperlipidemia) 06/08/2016    Orientation RESPIRATION BLADDER Height & Weight     Self  O2 (Nasal Cannula) Incontinent Weight: 132 lb 7.9 oz (60.1 kg) Height:  5\' 2"  (157.5 cm)  BEHAVIORAL SYMPTOMS/MOOD NEUROLOGICAL BOWEL NUTRITION STATUS      Continent Diet  AMBULATORY STATUS COMMUNICATION OF NEEDS Skin   Extensive Assist Verbally Surgical wounds (Right HIp Closed Incision with Hydrocolloid Wrap)                       Personal Care Assistance Level of Assistance  Bathing, Feeding, Dressing Bathing Assistance: Maximum assistance Feeding assistance: Limited assistance Dressing Assistance: Limited assistance     Functional Limitations Info             SPECIAL CARE FACTORS FREQUENCY  PT (By licensed PT), OT (By licensed OT)     PT Frequency: 2x week OT Frequency: 2x week            Contractures      Additional Factors Info  Code Status, Allergies Code Status Info: Full Code Allergies Info: Codeine            Current Medications (11/03/2016):  This is the current hospital active medication list Current Facility-Administered Medications  Medication Dose Route Frequency Provider Last Rate Last Dose  . acetaminophen (TYLENOL) tablet 650 mg  650 mg Oral Q6H PRN Yolonda Kida, MD       Or  . acetaminophen (TYLENOL) suppository 650 mg  650 mg Rectal Q6H PRN Yolonda Kida, MD      . chlorhexidine (HIBICLENS) 4 % liquid 4 application  60 mL Topical Once Yolonda Kida, MD      . docusate sodium (COLACE) capsule 100 mg  100 mg Oral BID Yolonda Kida, MD   100 mg at 11/03/16 5621  . donepezil (ARICEPT) tablet 10 mg  10 mg Oral QHS Fuller Plan, MD   10 mg at 11/02/16 2158  . HYDROcodone-acetaminophen (NORCO/VICODIN) 5-325 MG per tablet 1-2 tablet  1-2 tablet Oral Q4H PRN Yolonda Kida, MD      . ketotifen (ZADITOR) 0.025 % ophthalmic solution 1 drop  1 drop Both Eyes BID Fuller Plan, MD   1 drop at 11/03/16 [phone]  . lisinopril (PRINIVIL,ZESTRIL) tablet 5 mg  5 mg Oral QHS Rice, Jamesetta Orleans, MD   5 mg at 11/02/16 1912  . methocarbamol (ROBAXIN) tablet 500 mg  500 mg Oral Q6H PRN Yolonda Kida, MD  Or  . methocarbamol (ROBAXIN) 500 mg in dextrose 5 % 50 mL IVPB  500 mg Intravenous Q6H PRN Yolonda Kidaogers, Jason Patrick, MD      . metoCLOPramide [facility](REGLAN) tablet 5-10 mg  5-10 mg Oral Q8H PRN Yolonda Kidaogers, Jason Patrick, MD       Or  . metoCLOPramide [facility](REGLAN) injection 5-10 mg  5-10 mg Intravenous Q8H PRN Yolonda Kidaogers, Jason Patrick, MD      . metoprolol succinate (TOPROL-XL) 24 hr tablet 25 mg  25 mg Oral QHS Fuller Planice, Christopher W, MD   25 mg at 11/02/16 1912  . montelukast (SINGULAIR) tablet 10 mg  10 mg Oral QHS Fuller Planice, Christopher W, MD   10 mg at 11/02/16 2158  . morphine 4 MG/ML injection 1 mg  1 mg Intravenous Q2H PRN Carlynn PurlHoffman, Erik C, DO      . morphine 4 MG/ML injection 4 mg  4 mg Intravenous Q30 min PRN Linwood DibblesKnapp, Jon, MD   4 mg at 11/02/16 1049  . morphine 4  MG/ML injection 4 mg  4 mg Intravenous Q2H PRN Fuller Planice, Christopher W, MD      . ondansetron [facility](ZOFRAN) tablet 4 mg  4 mg Oral Q6H PRN Yolonda Kidaogers, Jason Patrick, MD       Or  . ondansetron [facility](ZOFRAN) injection 4 mg  4 mg Intravenous Q6H PRN Yolonda Kidaogers, Jason Patrick, MD      . oxyCODONE (Oxy IR/ROXICODONE) immediate release tablet 5 mg  5 mg Oral Q4H PRN Fuller Planice, Christopher W, MD   5 mg at 11/03/16 1418  . pantoprazole (PROTONIX) EC tablet 40 mg  40 mg Oral Daily Fuller Planice, Christopher W, MD   40 mg at 11/03/16 0918  . polyethylene glycol (MIRALAX / GLYCOLAX) packet 17 g  17 g Oral Daily Fuller Planice, Christopher W, MD   17 g at 11/03/16 [id number]  . senna-docusate (Senokot-S) tablet 1 tablet  1 tablet Oral QHS Fuller Planice, Christopher W, MD   1 tablet at 11/02/16 2159  . topiramate (TOPAMAX) tablet 25 mg  25 mg Oral QHS Fuller Planice, Christopher W, MD   25 mg at 11/02/16 2159     Discharge Medications: Please see discharge summary for a list of discharge medications.  Relevant Imaging Results:  Relevant Lab Results:   Additional Information SS#:[id number]  Tresa MoorePatricia V Lavayah Vita, LCSW

## 2016-11-03 NOTE — Evaluation (Signed)
Physical Therapy Evaluation Patient Details Name: Patricia Daniels MRN: [id number] DOB: Jul 28, 1919 Today's Date: 11/03/2016   History of Present Illness  Pt is a 83 y/o female s/p IM nail R hip secondary to fall. Pt is WBAT. PMH including but not limited to dementia and HTN.  Clinical Impression  Pt presented sitting OOB in recliner chair, awake and willing to participate in therapy session. Pt currently requires two person physical assistance for standing, transfers and bed mobility. Pt limited this session secondary to pain. Pt's RN aware and present during session, administering meds. Pt would continue to benefit from skilled physical therapy services at this time while admitted and after d/c to address the below listed limitations in order to improve overall safety and independence with functional mobility.      Follow Up Recommendations SNF;Supervision/Assistance - 24 hour    Equipment Recommendations  None recommended by PT    Recommendations for Other Services       Precautions / Restrictions Precautions Precautions: Fall Restrictions Weight Bearing Restrictions: Yes RLE Weight Bearing: Weight bearing as tolerated      Mobility  Bed Mobility Overal bed mobility: Needs Assistance Bed Mobility: Sit to Supine       Sit to supine: Max assist;+2 for physical assistance   General bed mobility comments: increased time, use of bed pads, assist with bilateral LEs and trunk  Transfers Overall transfer level: Needs assistance Equipment used: 2 person hand held assist Transfers: Sit to/from Stand;Stand Pivot Transfers Sit to Stand: Max assist;+2 physical assistance Stand pivot transfers: Max assist;+2 physical assistance       General transfer comment: increased time, cueing for hand position, physical assist of two to stand and pivot to bed towards her L  Ambulation/Gait                Stairs            Wheelchair Mobility    Modified Rankin (Stroke  Patients Only)       Balance Overall balance assessment: Needs assistance Sitting-balance support: Bilateral upper extremity supported;Feet supported Sitting balance-Leahy Scale: Poor     Standing balance support: Bilateral upper extremity supported;During functional activity Standing balance-Leahy Scale: Zero Standing balance comment: max A x2                             Pertinent Vitals/Pain Pain Assessment: Faces Faces Pain Scale: Hurts whole lot Pain Location: R hip Pain Descriptors / Indicators: Grimacing;Operative site guarding Pain Intervention(s): Monitored during session;Repositioned;RN gave pain meds during session    Home Living Family/patient expects to be discharged to:: Skilled nursing facility                      Prior Function Level of Independence: Independent with assistive device(s)         Comments: ambulated with use of RW     Hand Dominance   Dominant Hand: Right    Extremity/Trunk Assessment   Upper Extremity Assessment Upper Extremity Assessment: Generalized weakness    Lower Extremity Assessment Lower Extremity Assessment: Generalized weakness    Cervical / Trunk Assessment Cervical / Trunk Assessment: Kyphotic  Communication   Communication: No difficulties  Cognition Arousal/Alertness: Awake/alert Behavior During Therapy: WFL for tasks assessed/performed Overall Cognitive Status: History of cognitive impairments - at baseline  General Comments: pt very pleasant and thanking staff for any assistance; dementia at baseline      General Comments General comments (skin integrity, edema, etc.): redness and skin irritation again noted in peri area, anteriorly and posteriorly. Pt's RN present and aware.    Exercises     Assessment/Plan    PT Assessment Patient needs continued PT services  PT Problem List Decreased strength;Decreased range of motion;Decreased  activity tolerance;Decreased balance;Decreased mobility;Decreased coordination;Decreased cognition;Decreased knowledge of use of DME;Decreased safety awareness;Decreased knowledge of precautions;Pain       PT Treatment Interventions DME instruction;Gait training;Stair training;Therapeutic activities;Therapeutic exercise;Cognitive remediation;Functional mobility training;Neuromuscular re-education;Balance training;Patient/family education    PT Goals (Current goals can be found in the Care Plan section)  Acute Rehab PT Goals Patient Stated Goal: none stated PT Goal Formulation: Patient unable to participate in goal setting Time For Goal Achievement: 11/17/16 Potential to Achieve Goals: Fair    Frequency Min 2X/week   Barriers to discharge        Co-evaluation               AM-PAC PT "6 Clicks" Daily Activity  Outcome Measure Difficulty turning over in bed (including adjusting bedclothes, sheets and blankets)?: Total Difficulty moving from lying on back to sitting on the side of the bed? : Total Difficulty sitting down on and standing up from a chair with arms (e.g., wheelchair, bedside commode, etc,.)?: Total Help needed moving to and from a bed to chair (including a wheelchair)?: A Lot Help needed walking in hospital room?: Total Help needed climbing 3-5 steps with a railing? : Total 6 Click Score: 7    End of Session Equipment Utilized During Treatment: Gait belt Activity Tolerance: Patient limited by pain Patient left: in bed;with call bell/phone within reach;with bed alarm set Nurse Communication: Mobility status PT Visit Diagnosis: Other abnormalities of gait and mobility (R26.89);Pain Pain - Right/Left: Right Pain - part of body: Hip;Leg    Time: [id number] PT Time Calculation (min) (ACUTE ONLY): 12 min   Charges:   PT Evaluation $PT Eval Moderate Complexity: 1 Procedure     PT G Codes:        Deborah ChalkJennifer Kathan Kirker, PT, DPT [phone]   Alessandra BevelsJennifer M  Jazmeen Axtell 11/03/2016, 3:30 PM

## 2016-11-04 NOTE — Progress Notes (Signed)
Internal Medicine Attending:   I saw and examined the patient. I reviewed the resident's note and I agree with the resident's findings and plan as documented in the resident's note. Pain reasonably well controlled no other complaints.  Plan for D/C to SNF currently awaiting insurance approval.

## 2016-11-04 NOTE — Progress Notes (Signed)
   Subjective:  Patient reports pain as mild.  At baseline confusion.  Objective:   VITALS:   Vitals:   11/04/16 0428 11/04/16 1500 11/04/16 2124 11/04/16 2127  BP: (!) 129/37 (!) 150/49 (!) 151/43   Pulse: 72 71 68   Resp: 18 18 18    Temp: 97.8 F (36.6 C) 98 F (36.7 C)  97.8 F (36.6 C)  TempSrc: Oral Oral  Oral  SpO2: 93% 93% 93%   Weight:      Height:        RLE- Dressings c/d/i, no drainage Endorses SILT dp/sp/sur/saph, by responding to toiuch Spontaneously moves TA/GSC/FHL/EHL 2+ DP  Lab Results  Component Value Date   WBC 9.0 11/03/2016   HGB 9.4 (L) 11/03/2016   HCT 28.5 (L) 11/03/2016   MCV 93.4 11/03/2016   PLT 240 11/03/2016   BMET    Component Value Date/Time   NA 139 11/03/2016 0427   NA 142 04/24/2011 1142   K 3.4 (L) 11/03/2016 0427   K 4.0 04/24/2011 1142   CL 105 11/03/2016 0427   CL 108 (H) 04/24/2011 1142   CO2 27 11/03/2016 0427   CO2 26 04/24/2011 1142   GLUCOSE 121 (H) 11/03/2016 0427   GLUCOSE 99 04/24/2011 1142   BUN 9 11/03/2016 0427   BUN 20 (H) 04/24/2011 1142   CREATININE 0.91 11/03/2016 0427   CREATININE 0.91 04/24/2011 1142   CALCIUM 8.4 (L) 11/03/2016 0427   CALCIUM 9.0 04/24/2011 1142   GFRNONAA 52 (L) 11/03/2016 0427   GFRNONAA >60 04/24/2011 1142   GFRAA >60 11/03/2016 0427   GFRAA >60 04/24/2011 1142     Assessment/Plan: 2 Days Post-Op   Principal Problem:   Closed displaced intertrochanteric fracture of right femur (HCC) Active Problems:   Dementia   Advance diet Up with therapy recommending SNF WBAT RLE Bid 81 mg asa with SCDs for DVT ppx   Yolonda KidaJason Patrick Glanda Spanbauer 11/04/2016, 10:21 PM   Maryan RuedJason P Alejandro Gamel, MD [phone]

## 2016-11-04 NOTE — Social Work (Signed)
CSW received Insurance [id number]#[id number] to go to [facility].   CSW following up on dc to SNF when patient medically ready.  CSW advised Verlon AuLeslie in admissions of same.  Keene BreathPatricia Maleigha Colvard, LCSW Clinical Social Worker [phone]

## 2016-11-04 NOTE — Social Work (Addendum)
CSW followed up again with [facility] and they have accepted patient for SNF.  CSW called son and confirmed acceptance into [facility]. CSW advised son that insurance auth would need to be obtained prior to DC. Son in agreement.  CSW faxed clinicals to [facility] to obtain [employer] Auth. CSW will continue to follow.  Keene BreathPatricia Jatavia Keltner, LCSW Clinical Social Worker [phone]

## 2016-11-04 NOTE — Progress Notes (Signed)
   Subjective: [name] was seen laying in her bed today and was rubbing her bilateral eyes. She stated that she was not in any pain, did not have any sob, or chest pain. She appeared less oriented today and was less interactive.    Objective:  Vital signs in last 24 hours: Vitals:   11/03/16 1500 11/03/16 2013 11/04/16 0428 11/04/16 1500  BP: (!) 135/49 (!) 129/48 (!) 129/37 (!) 150/49  Pulse: 85 85 72 71  Resp: 16 18 18 18   Temp: 97.7 F (36.5 C) 98.9 F (37.2 C) 97.8 F (36.6 C) 98 F (36.7 C)  TempSrc: Oral Oral Oral Oral  SpO2: 94% 92% 93% 93%  Weight:      Height:       Physical Exam  HENT:  Head: Normocephalic and atraumatic.  Eyes: Conjunctivae are normal. Right eye exhibits no discharge. Left eye exhibits no discharge. No scleral icterus.  Cardiovascular: Normal rate, regular rhythm, normal heart sounds and intact distal pulses.   Abdominal: Soft. Bowel sounds are normal.  Musculoskeletal:       Right shoulder: She exhibits decreased range of motion (right hip) and swelling (right in comparison to left ).  Skin: There is erythema (mild erythema noted over right hip).   Assessment/Plan:  Right hip closed intertrochanteric fracture -Patient s/p intramedullary implant on 11/02/16 to treat her trochanteric fracture.  -Pt needs two person assistance with mobility and standing.  -Pending vitamin d level -Patient accepted [facility] for discharge tomorrow 11/05/16 -Aspirin81 bid, scd for dvt prophylaxis  HTN Range of blood pressure over the past 24hrs has been 129-135/37-49. -Hold lisinopril 5mg  qd due to low diastolic bp -Continue metoprolol succinate 25mg  qd.  Dementia Continue donepezil   Dispo: Anticipated discharge in approximately 0-1 day(s).   Patricia CourierVahini Kinser Fellman, MD Internal Medicine PGY1 Pager:[phone] 11/04/2016, 6:04 PM

## 2016-11-04 NOTE — Progress Notes (Signed)
Initial Nutrition Assessment  DOCUMENTATION CODES:   Not applicable  INTERVENTION:  Continue Ensure Enlive po BID, each supplement provides 350 kcal and 20 grams of protein.  Encourage adequate PO intake.   NUTRITION DIAGNOSIS:   Increased nutrient needs related to  (post op healing) as evidenced by estimated needs.  GOAL:   Patient will meet greater than or equal to 90% of their needs  MONITOR:   PO intake, Supplement acceptance, Labs, Weight trends, Skin, I & O's  REASON FOR ASSESSMENT:   Malnutrition Screening Tool    ASSESSMENT:   83 yo female with PMH of HTN, dementia and osteoporotic frx of her left ankle 4 months prior who presented to the ED after a fall at home. pelvic x-ray revealed right hip fracture  PROCEDURE (7/23): Treatment of intertrochanteric, pertrochanteric, subtrochanteric fracture with intramedullary implant.   No family at bedside. Pt reports having a good appetite currently and PTA with usual consumption of at least 3 meals a day. Meal completion has been 25-50%. Pt currently has Ensure ordered and has been consuming them. RD to continue with current orders. Pt with no weight loss per weight records.   Nutrition-Focused physical exam completed. Findings are no fat depletion, mild to moderate muscle depletion, and mild edema. Depletion likely related to the natural aging process.   Labs and medications reviewed.   Diet Order:  Diet regular Room service appropriate? Yes; Fluid consistency: Thin  Skin:   (Incision on R hip and head)  Last BM:  Unknown  Height:   Ht Readings from Last 1 Encounters:  11/02/16 5\' 2"  (1.575 m)    Weight:   Wt Readings from Last 1 Encounters:  11/02/16 132 lb 7.9 oz (60.1 kg)    Ideal Body Weight:  50 kg  BMI:  Body mass index is 24.23 kg/m.  Estimated Nutritional Needs:   Kcal:  1500-1650  Protein:  60-70 grams  Fluid:  >/= 1.5 L/day  EDUCATION NEEDS:   No education needs identified at this  time  Patricia SmilingStephanie Jodel Mayhall, MS, RD, LDN Pager # [phone] After hours/ weekend pager # [phone]

## 2016-11-05 DIAGNOSIS — R4182 Altered mental status, unspecified: Secondary | ICD-10-CM | POA: Diagnosis not present

## 2016-11-05 DIAGNOSIS — J4520 Mild intermittent asthma, uncomplicated: Secondary | ICD-10-CM | POA: Diagnosis not present

## 2016-11-05 DIAGNOSIS — S72141D Displaced intertrochanteric fracture of right femur, subsequent encounter for closed fracture with routine healing: Secondary | ICD-10-CM | POA: Diagnosis not present

## 2016-11-05 DIAGNOSIS — R6889 Other general symptoms and signs: Secondary | ICD-10-CM | POA: Diagnosis not present

## 2016-11-05 DIAGNOSIS — Z79899 Other long term (current) drug therapy: Secondary | ICD-10-CM | POA: Diagnosis not present

## 2016-11-05 DIAGNOSIS — K219 Gastro-esophageal reflux disease without esophagitis: Secondary | ICD-10-CM | POA: Diagnosis not present

## 2016-11-05 DIAGNOSIS — W19XXXD Unspecified fall, subsequent encounter: Secondary | ICD-10-CM | POA: Diagnosis not present

## 2016-11-05 DIAGNOSIS — E559 Vitamin D deficiency, unspecified: Secondary | ICD-10-CM | POA: Diagnosis not present

## 2016-11-05 DIAGNOSIS — I350 Nonrheumatic aortic (valve) stenosis: Secondary | ICD-10-CM | POA: Diagnosis not present

## 2016-11-05 DIAGNOSIS — M8000XD Age-related osteoporosis with current pathological fracture, unspecified site, subsequent encounter for fracture with routine healing: Secondary | ICD-10-CM | POA: Diagnosis not present

## 2016-11-05 DIAGNOSIS — Z7401 Bed confinement status: Secondary | ICD-10-CM | POA: Diagnosis not present

## 2016-11-05 DIAGNOSIS — G40909 Epilepsy, unspecified, not intractable, without status epilepticus: Secondary | ICD-10-CM | POA: Diagnosis not present

## 2016-11-05 DIAGNOSIS — F0390 Unspecified dementia without behavioral disturbance: Secondary | ICD-10-CM | POA: Diagnosis not present

## 2016-11-05 DIAGNOSIS — M25551 Pain in right hip: Secondary | ICD-10-CM | POA: Diagnosis not present

## 2016-11-05 DIAGNOSIS — M25552 Pain in left hip: Secondary | ICD-10-CM | POA: Diagnosis not present

## 2016-11-05 DIAGNOSIS — S728X9A Other fracture of unspecified femur, initial encounter for closed fracture: Secondary | ICD-10-CM | POA: Diagnosis not present

## 2016-11-05 DIAGNOSIS — J9811 Atelectasis: Secondary | ICD-10-CM | POA: Diagnosis not present

## 2016-11-05 DIAGNOSIS — Z7982 Long term (current) use of aspirin: Secondary | ICD-10-CM | POA: Diagnosis not present

## 2016-11-05 DIAGNOSIS — L8990 Pressure ulcer of unspecified site, unspecified stage: Secondary | ICD-10-CM | POA: Insufficient documentation

## 2016-11-05 DIAGNOSIS — I10 Essential (primary) hypertension: Secondary | ICD-10-CM | POA: Diagnosis not present

## 2016-11-05 DIAGNOSIS — M25559 Pain in unspecified hip: Secondary | ICD-10-CM | POA: Diagnosis not present

## 2016-11-05 DIAGNOSIS — S72141A Displaced intertrochanteric fracture of right femur, initial encounter for closed fracture: Secondary | ICD-10-CM | POA: Diagnosis not present

## 2016-11-05 LAB — VITAMIN D 25 HYDROXY (VIT D DEFICIENCY, FRACTURES): VIT D 25 HYDROXY: 15.2 ng/mL — AB (ref 30.0–100.0)

## 2016-11-05 MED ORDER — ERGOCALCIFEROL 1.25 MG (50000 UT) PO CAPS
50000.0000 [IU] | ORAL_CAPSULE | ORAL | 0 refills | Status: AC
Start: 2016-11-05 — End: 2017-01-22

## 2016-11-05 MED ORDER — LISINOPRIL 5 MG PO TABS: 5.0000 mg | ORAL_TABLET | Freq: Every day | ORAL | Status: DC

## 2016-11-05 MED ORDER — POTASSIUM CHLORIDE CRYS ER 20 MEQ PO TBCR
40.0000 meq | EXTENDED_RELEASE_TABLET | Freq: Once | ORAL | Status: AC
  Administered 2016-11-05: 40 meq via ORAL
  Filled 2016-11-05: qty 2

## 2016-11-05 NOTE — Progress Notes (Signed)
[facility] [place]. SBAR reviewed in detail with receiving RN with all questions answered. Pt discharged to facility via stretcher with PT[facility] transportation in stable condition @ approx. 1645.

## 2016-11-05 NOTE — Social Work (Signed)
Clinical Social Worker facilitated patient discharge including contacting patient family and facility to confirm patient discharge plans.  Clinical information faxed to facility and family agreeable with plan.    CSW arranged [facility] transport via [facility]    RN to call [phone] to give report prior to discharge.  Clinical Social Worker will sign off for now as social work intervention is no longer needed. Please consult us again if new need arises.  Keene BreathPatricia Nakina Spatz, LCSW Clinical Social Worker [phone]

## 2016-11-05 NOTE — Progress Notes (Signed)
Internal Medicine Attending:   I saw and examined the patient. I reviewed the resident's note and I agree with the resident's findings and plan as documented in the resident's note. Mild low back pain today, otherwise no complaints.  Noted to be vitamin d deficient and we have started on replacement.  Otherwise stable for discharge to SNF.

## 2016-11-05 NOTE — Clinical Social Work Placement (Signed)
   CLINICAL SOCIAL WORK PLACEMENT  NOTE  Date:  11/05/2016  Patient Details  Name: Patricia Daniels MRN: [id number] Date of Birth: 06-14-1919  Clinical Social Work is seeking post-discharge placement for this patient at the Skilled  Nursing Facility level of care (*CSW will initial, date and re-position this form in  chart as items are completed):  Yes   Patient/family provided with [facility] Clinical Social Work Department's list of facilities offering this level of care within the geographic area requested by the patient (or if unable, by the patient's family).  Yes   Patient/family informed of their freedom to choose among providers that offer the needed level of care, that participate in Medicare, Medicaid or managed care program needed by the patient, have an available bed and are willing to accept the patient.  Yes   Patient/family informed of [facility]'s ownership interest in [facility] and [facility], as well as of the fact that they are under no obligation to receive care at these facilities.  PASRR submitted to EDS on       PASRR number received on 11/03/16     Existing PASRR number confirmed on 11/03/16     FL2 transmitted to all facilities in geographic area requested by pt/family on 11/03/16     FL2 transmitted to all facilities within larger geographic area on 11/03/16     Patient informed that his/her managed care company has contracts with or will negotiate with certain facilities, including the following:        Yes   Patient/family informed of bed offers received.  Patient [facility] bed at [facility] [place]     Physician recommends and patient chooses bed at      Patient to be transferred to [facility] on 11/05/16.  Patient to be transferred to facility by [facility]     Patient family notified on 11/05/16 of transfer.  Name of family member notified:  Mr. Orson AloeHenderson     PHYSICIAN Please prepare priority discharge summary,  including medications, Please sign FL2, Please prepare prescriptions     Additional Comment:    _______________________________________________ Tresa MoorePatricia V Nilay Mangrum, LCSW 11/05/2016, 1:51 PM

## 2016-11-05 NOTE — Care Management Note (Signed)
Case Management Note  Patient Details  Name: Trula Sladenna T Rockett MRN: [id number] Date of Birth: 03-May-1919  Subjective/Objective:   Closed displaced intertrochanteric fracture of right femur                  Action/Plan: Discharge Planning: Chart reviewed. CSW following for SNF placement. Scheduled dc to SNF today.   [facility]   Expected Discharge Date:                 Expected Discharge Plan:  Skilled Nursing Facility  In-House Referral:  Clinical Social Work  Discharge planning Services  CM Consult  Post Acute Care Choice:  NA Choice offered to:  NA  DME Arranged:  N/A DME Agency:  NA  HH Arranged:  NA HH Agency:  NA  Status of Service:  Completed, signed off  If discussed at Long Length of Stay Meetings, dates discussed:    Additional Comments:  Elliot CousinShavis, Kaamil Morefield Ellen, RN 11/05/2016, 12:16 PM

## 2016-11-05 NOTE — Progress Notes (Signed)
Physical Therapy Treatment Patient Details Name: Patricia Daniels MRN: [id number] DOB: July 15, 1919 Today's Date: 11/05/2016    History of Present Illness Pt is a 83 y/o female s/p IM nail R hip secondary to fall. Pt is WBAT. PMH including but not limited to dementia and HTN.    PT Comments    Patient is pleasant and willing to participate in therapy session. Patient continues to require max A +2 for mobility.  Current plan remains appropriate.   Follow Up Recommendations  SNF;Supervision/Assistance - 24 hour     Equipment Recommendations  None recommended by PT    Recommendations for Other Services       Precautions / Restrictions Precautions Precautions: Fall Restrictions Weight Bearing Restrictions: Yes RLE Weight Bearing: Weight bearing as tolerated    Mobility  Bed Mobility Overal bed mobility: Needs Assistance Bed Mobility: Rolling;Sidelying to Sit;Sit to Supine Rolling: Max assist Sidelying to sit: Max assist;+2 for physical assistance   Sit to supine: Max assist;+2 for physical assistance   General bed mobility comments: assist at trunk and bilat LE; use of bed pad and pt holding onto bed rails  Transfers                    Ambulation/Gait                 Stairs            Wheelchair Mobility    Modified Rankin (Stroke Patients Only)       Balance Overall balance assessment: Needs assistance Sitting-balance support: Bilateral upper extremity supported;Feet supported Sitting balance-Leahy Scale: Fair Sitting balance - Comments: pt able to maintain sitting balance with min guard for safety                                    Cognition Arousal/Alertness: Awake/alert Behavior During Therapy: WFL for tasks assessed/performed Overall Cognitive Status: History of cognitive impairments - at baseline                                 General Comments: pt very pleasant and thanking staff for any assistance;  dementia at baseline      Exercises      General Comments General comments (skin integrity, edema, etc.): pt with redness/skin breakdown in peri area; RN notified and dressings applied       Pertinent Vitals/Pain Pain Assessment: Faces Faces Pain Scale: Hurts whole lot Pain Location: R hip-mostly; aslo c/o eyes "hurting" Pain Descriptors / Indicators: Grimacing;Guarding;Moaning Pain Intervention(s): Limited activity within patient's tolerance;Monitored during session;Repositioned;Patient requesting pain meds-RN notified    Home Living                      Prior Function            PT Goals (current goals can now be found in the care plan section) Acute Rehab PT Goals Patient Stated Goal: none stated PT Goal Formulation: Patient unable to participate in goal setting Time For Goal Achievement: 11/17/16 Potential to Achieve Goals: Fair Progress towards PT goals: Progressing toward goals    Frequency    Min 2X/week      PT Plan Current plan remains appropriate    Co-evaluation              AM-PAC PT "6 Clicks" Daily Activity  Outcome Measure  Difficulty turning over in bed (including adjusting bedclothes, sheets and blankets)?: Total Difficulty moving from lying on back to sitting on the side of the bed? : Total Difficulty sitting down on and standing up from a chair with arms (e.g., wheelchair, bedside commode, etc,.)?: Total Help needed moving to and from a bed to chair (including a wheelchair)?: A Lot Help needed walking in hospital room?: Total Help needed climbing 3-5 steps with a railing? : Total 6 Click Score: 7    End of Session   Activity Tolerance: Patient limited by pain Patient left: in bed;with call bell/phone within reach;with bed alarm set;with nursing/sitter in room Nurse Communication: Mobility status PT Visit Diagnosis: Other abnormalities of gait and mobility (R26.89);Pain Pain - Right/Left: Right Pain - part of body:  Hip;Leg     Time: 1013-1050 PT Time Calculation (min) (ACUTE ONLY): 37 min  Charges:  $Therapeutic Activity: 23-37 mins                    G Codes:       Patricia Daniels, PTA Pager: [phone]     Patricia Daniels 11/05/2016, 11:20 AM

## 2016-11-05 NOTE — Care Management Important Message (Signed)
Important Message  Patient Details  Name: Patricia Daniels MRN: [id number] Date of Birth: [birth year]   Medicare Important Message Given:  Yes    Elliot CousinShavis, Dontel Harshberger Ellen, RN 11/05/2016, 12:15 PM

## 2016-11-05 NOTE — Discharge Summary (Signed)
Name: Patricia Daniels MRN: [id number] DOB: 07-Jul-1919 83 y.o. PCP: [facility]  Date of Admission: 11/02/2016  9:32 AM Date of Discharge: 11/05/2016 Attending Physician: Lucious Groves, DO  Discharge Diagnosis:   Closed displaced intertrochanteric fracture of right femur ([place])  Dementia  Bradycardia  Vitamin D Deficiency  Discharge Medications: Allergies as of 11/05/2016      Reactions   Codeine Other (See Comments)   Son states it makes her "not feel good"      Medication List    TAKE these medications   acetaminophen 325 MG tablet Commonly known as:  TYLENOL Take 2 tablets (650 mg total) by mouth every 6 (six) hours as needed for mild pain (or Fever >/= 101).   aspirin EC 81 MG tablet Take 1 tablet (81 mg total) by mouth 2 (two) times daily.   azelastine 0.05 % ophthalmic solution Commonly known as:  OPTIVAR Place 2 drops into both eyes 2 (two) times daily as needed. irritation   donepezil 10 MG tablet Commonly known as:  ARICEPT Take 10 mg by mouth at bedtime.   ergocalciferol 50000 units capsule Commonly known as:  VITAMIN D2 Take 1 capsule (50,000 Units total) by mouth once a week.   lisinopril 5 MG tablet Commonly known as:  PRINIVIL,ZESTRIL Take 2 tablets (10 mg total) by mouth daily. What changed:  how much to take  when to take this   metoprolol succinate 25 MG 24 hr tablet Commonly known as:  TOPROL-XL Take 25 mg by mouth at bedtime.   montelukast 10 MG tablet Commonly known as:  SINGULAIR Take 10 mg by mouth at bedtime.   nystatin cream Commonly known as:  MYCOSTATIN Apply 1 application topically 2 (two) times daily as needed for rash.   pantoprazole 40 MG tablet Commonly known as:  PROTONIX Take 40 mg by mouth daily.   topiramate 25 MG tablet Commonly known as:  TOPAMAX Take 25 mg by mouth at bedtime.   traMADol 50 MG tablet Commonly known as:  ULTRAM Take 1-2 tablets (50-100 mg total) by mouth every 6 (six) hours as  needed.       Disposition and follow-up:   Patricia Daniels was discharged from [facility] in stable condition.  At the hospital follow up visit please address:  1.  Pain control, vit d deficiency, falls risk, dementia  2.  Labs / imaging needed at time of follow-up: vitamin d level, potassium level  3.  Pending labs/ test needing follow-up: staple removal in 2 weeks.  Follow-up Appointments:  Contact information for follow-up providers    Schedule an appointment as soon as possible for a visit with Stann Mainland Elly Modena, MD.   Specialty:  Orthopedic Surgery Why:  For suture removal Contact information: [address] [place] [address] [phone]            Contact information for after-discharge care    [facility] .   Specialty:  Skilled Nursing Facility Contact information: [facility] [place] [facility] Course by problem list:  Closed displaced intertrochanteric fracture of right femur [facility]) [name] presented following a fall . The patient's baseline dementia made it difficult to gather a good history and find out if there were any provoking factors that prompted her fall. She has  mild aortic stenosis per her echo done 05/2016, but that is unlikely to have prompted a syncopal episode leading to a fall. Imaging revealed a right intertrochanteric fracture.  She was seen by orthopedics and an intramedullary implant and two screws were placed on 11/02/16. She has been recovering well. After surgery her hb=9.4 and hct=28.5. The patient was encouraged to continue pt/ot and take aspirin 62m bid for postoperative anticoagulation.  Dementia The patient has had baseline dementia on admission. We continued donezepil throughout her admission.   Vitamin D Deficiency The patient's vit d level was 15.2. She was discharged with 50000 Vit  D2 weekly for 12 weeks.    Discharge Vitals:   BP (!) 154/43 (BP Location: Right Arm)   Pulse 65   Temp 98.3 F (36.8 C) (Oral)   Resp 18   Ht 5' 2"  (1.575 m)   Wt 132 lb 7.9 oz (60.1 kg)   SpO2 96%   BMI 24.23 kg/m   Pertinent Labs, Studies, and Procedures:   Vit D, 25 Hydroxy=15.2  UA= protein=30; negative ketones, leukocytes, nitrites; no bacteria seen  BMP (7/24): k=3.4, glu=121, na=139, cr=0.91, bun=9  DG Hips bilat w or w/o pelvis (7/23): Acute, moderately angulated right femoral intertrochanteric Fracture.  DG chest (7/23): No evidence of acute cardiopulmonary disease.  CT Cervical spine wo contrast (7/23): No cervical spine fracture or abnormal prevertebral soft tissue swelling.  Multilevel cervical spondylotic changes minimally changed from prior Exam.  CT head wo contrast (7/23): No acute intracranial abnormality.Atrophy, chronic microvascular disease.  Diffuse degenerative disc and facet disease in the cervical spine. No acute bony abnormality.  Small nodule in the left thyroid nodule.  DG Hip operative unilat (7/23): Open reduction and internal fixation right intertrochanteric Fracture.  DG C-arm (7/23): Open reduction and internal fixation right intertrochanteric Fracture.  DG Femur port (7/23): Internal fixation of the right femur fracture. No complicating features.    Discharge Instructions: Discharge Instructions    Call MD for:  persistant dizziness or light-headedness    Complete by:  As directed    Call MD for:  redness, tenderness, or signs of infection (pain, swelling, redness, odor or green/yellow discharge around incision site)    Complete by:  As directed    Call MD for:  severe uncontrolled pain    Complete by:  As directed    Call MD for:  temperature >100.4    Complete by:  As directed    Diet - low sodium heart healthy    Complete by:  As directed    Discharge instructions    Complete by:  As directed    Thank you for  allowing [facility] be a part of your care. [facility] You had a fracture in your right leg that was fixed with an intermedullary rod. Please execute caution as you ambulate and keep your environment clear so that you do not fall. Please contact immediately if you have any questions. Thank you!  Am I at risk of falling? - Your risk of falling increases as you grow older. That's because getting older can make it harder to walk steadily and keep your balance. Also, the effects of falls are more serious in older people.  Overall, 3 to 4 out of every 10 people over the age of 626fall each year. Up to 729percent of people who fracture a hip never recover to the point they were before they had their fracture.  If you have fallen in the past, you are at higher risk of falling again.  Several things can increase your risk of a fall, including:  Illness   A change in the medicines you take   An unsafe or unfamiliar setting (for example, a room with rugs or furniture that might trip you, or an area you don't know well)   How can my doctor help me to avoid falling? - Your doctor can talk to you about the following things:  Past falls - It is important to tell your doctor about any times you have fallen or almost fallen. He or she can then suggest ways to prevent another fall.   Your health conditions - Some health problems can put you at risk of falling. These include conditions that affect eyesight, hearing, muscle strength, or balance.   The medicines you take - Certain medicines can increase the risk of falling. These include some medicines that are used for sleeping problems, anxiety, or depression. Adding new medicines, or changing doses of some medicines, can also affect your risk of falling.   The more your doctor knows about your situation, the better he or she will be able to help you. For example, if you fell because you have a condition that  causes pain, your doctor might suggest treatments to deal with the pain. Or if one of your medicines is making you dizzy and more likely to fall, your doctor might switch you to a different medicine.  Is there anything I can do on my own? - Yes. To help keep from falling, you can:  Make your home safer - To avoid falling at home, get rid of things that might make you trip or slip. This might include furniture, electrical cords, clutter, and loose rugs (figure 1). Keep your home well-lit so that you can easily see where you are going. Avoid storing things in high places so you don't have to reach or climb.   Wear sturdy shoes that fit well - Wearing shoes with high heels or slippery soles, or shoes that are too loose, can lead to falls. Walking around in bare feet, or only socks, can also increase your risk of falling.   Take vitamin D pills - Taking vitamin D might lower the risk of falls in older people. This is because vitamin D helps make bones and muscles stronger. Your doctor can talk to you about whether you should take extra vitamin D, and how much.   Stay active - Exercising on a regular basis can help lower your risk of falling. It might also help prevent you from getting hurt if you do fall. It is best to do a few different activities that help with both strength and balance. There are many kinds of exercise that can be safe for older people. These include walking, swimming, and Tai Chi (a [place] martial art that involves slow, gentle movements).   Use a cane, walker, and other safety devices - If your doctor recommends that you use a cane or walker, be sure that it's the right size and you know how to use it. There are other devices that might help you avoid falling, too. These include grab bars or a sturdy seat for the shower, non-slip bath mats, and hand rails or treads for the stairs (to prevent slipping).   If you worry that you could fall, there are also alarm buttons that let you  call for help if you fall and can't get  up.  What should I do if I fall? - If you fall, see your doctor right away, even if you aren't hurt. Your doctor can try to figure out what caused you to fall, and how likely you are to fall again. He or she will do an exam and talk to you about your health problems, medicines, and activities. Then he or she can suggest things you can do to avoid falling again.  Many older people have a hard time recovering after a fall. Doing things to prevent falling can help you to protect your health and independence.   Increase activity slowly    Complete by:  As directed       Signed: Lars Mage, MD Internal Medicine PGY1 Pager:[phone] 11/05/2016, 1:09 PM

## 2016-11-05 NOTE — Progress Notes (Addendum)
   Subjective: [name] was seen resting in her bed this morning. She stated that she had pain in her lower back, but believes that she has had this prior to her hospitalization. She states she also has pain in her right hip area. She denied any sob or chest pain.   Objective:  Vital signs in last 24 hours: Vitals:   11/04/16 1500 11/04/16 2124 11/04/16 2127 11/05/16 0506  BP: (!) 150/49 (!) 151/43  (!) 154/43  Pulse: 71 68  65  Resp: 18 18  18   Temp: 98 F (36.7 C)  97.8 F (36.6 C) 98.3 F (36.8 C)  TempSrc: Oral  Oral Oral  SpO2: 93% 93%  96%  Weight:      Height:       Physical Exam  HENT:  Head: Normocephalic and atraumatic.  Eyes: Right eye exhibits no discharge. Left eye exhibits no discharge. No scleral icterus.  Cardiovascular: Normal rate and intact distal pulses.   Murmur (systolic) heard. Pulmonary/Chest: Effort normal. No respiratory distress. She has no wheezes.  Abdominal: Soft. Bowel sounds are normal. She exhibits no distension. There is no tenderness.  Musculoskeletal:       Right hip: She exhibits decreased range of motion, decreased strength and tenderness.  Neurological:  dementia     Assessment/Plan: Right hip closed intertrochanteric fracture -Patient s/p intramedullary implant on 11/02/16 to treat her trochanteric fracture.  -[facility] -Continue PT/OT at snf -Aspirin81 bid, scd for dvt prophylaxis  HTN Range of blood pressure over the past 24hrs has been 150-154/43-49. -Restarted home lisinopril 5mg , continue metoprolol succinate 25mg  qd.  Vitamin D Deficiency Patient's vitamin D level is 15.2.  -Prescribed 50000 D2 weekly for 12 weeks.  Hypokalemia K=3.4 today.  -Administered Kdur 40meq po once  Dementia Continue donepezil  Dispo: Anticipated discharge today.   Patricia CourierVahini Jayra Choyce, MD Internal Medicine PGY1 Pager:[phone] 11/05/2016, 2:13 PM

## 2016-11-06 ENCOUNTER — Emergency Department
Admission: EM | Admit: 2016-11-06 | Discharge: 2016-11-06 | Disposition: A | Discharge status: Home / Self Care | Payer: Medicare HMO | Attending: Emergency Medicine | Admitting: Emergency Medicine

## 2016-11-06 ENCOUNTER — Emergency Department: Payer: Medicare HMO

## 2016-11-06 DIAGNOSIS — F0390 Unspecified dementia without behavioral disturbance: Secondary | ICD-10-CM | POA: Diagnosis not present

## 2016-11-06 DIAGNOSIS — M25551 Pain in right hip: Secondary | ICD-10-CM | POA: Diagnosis not present

## 2016-11-06 DIAGNOSIS — Z7982 Long term (current) use of aspirin: Secondary | ICD-10-CM | POA: Diagnosis not present

## 2016-11-06 DIAGNOSIS — M25552 Pain in left hip: Secondary | ICD-10-CM | POA: Diagnosis not present

## 2016-11-06 DIAGNOSIS — I10 Essential (primary) hypertension: Secondary | ICD-10-CM | POA: Diagnosis not present

## 2016-11-06 DIAGNOSIS — M8000XD Age-related osteoporosis with current pathological fracture, unspecified site, subsequent encounter for fracture with routine healing: Secondary | ICD-10-CM | POA: Diagnosis not present

## 2016-11-06 DIAGNOSIS — Z79899 Other long term (current) drug therapy: Secondary | ICD-10-CM | POA: Diagnosis not present

## 2016-11-06 DIAGNOSIS — I350 Nonrheumatic aortic (valve) stenosis: Secondary | ICD-10-CM | POA: Diagnosis not present

## 2016-11-06 DIAGNOSIS — J9811 Atelectasis: Secondary | ICD-10-CM | POA: Diagnosis not present

## 2016-11-06 LAB — BASIC METABOLIC PANEL
ANION GAP: 7 (ref 5–15)
BUN: 18 mg/dL (ref 6–20)
CO2: 27 mmol/L (ref 22–32)
Calcium: 8.4 mg/dL — ABNORMAL LOW (ref 8.9–10.3)
Chloride: 107 mmol/L (ref 101–111)
Creatinine, Ser: 0.76 mg/dL (ref 0.44–1.00)
GFR calc Af Amer: >60 mL/min (ref >60)
GLUCOSE: 100 mg/dL — AB (ref 65–99)
POTASSIUM: 4.2 mmol/L (ref 3.5–5.1)
Sodium: 141 mmol/L (ref 135–145)

## 2016-11-06 LAB — CBC WITH DIFFERENTIAL/PLATELET
BASOS ABS: 0.1 10*3/uL (ref 0–0.1)
Basophils Relative: 0 %
EOS PCT: 0 %
Eosinophils Absolute: 0 10*3/uL (ref 0–0.7)
HEMATOCRIT: 26.8 % — AB (ref 35.0–47.0)
Hemoglobin: 9 g/dL — ABNORMAL LOW (ref 12.0–16.0)
LYMPHS ABS: 1.3 10*3/uL (ref 1.0–3.6)
LYMPHS PCT: 10 %
MCH: 31 pg (ref 26.0–34.0)
MCHC: 33.4 g/dL (ref 32.0–36.0)
MCV: 92.7 fL (ref 80.0–100.0)
MONO ABS: 1.2 10*3/uL — AB (ref 0.2–0.9)
MONOS PCT: 9 %
NEUTROS ABS: 11.1 10*3/uL — AB (ref 1.4–6.5)
Neutrophils Relative %: 81 %
PLATELETS: 311 10*3/uL (ref 150–440)
RBC: 2.89 MIL/uL — ABNORMAL LOW (ref 3.80–5.20)
RDW: 14.2 % (ref 11.5–14.5)
WBC: 13.6 10*3/uL — ABNORMAL HIGH (ref 3.6–11.0)

## 2016-11-06 MED ORDER — SODIUM CHLORIDE 0.9 % IV BOLUS (SEPSIS)
1000.0000 mL | Freq: Once | INTRAVENOUS | Status: AC
  Administered 2016-11-06: 1000 mL via INTRAVENOUS

## 2016-11-06 MED ORDER — AMLODIPINE BESYLATE 5 MG PO TABS
ORAL_TABLET | ORAL | Status: AC
  Filled 2016-11-06: qty 1

## 2016-11-06 MED ORDER — AMLODIPINE BESYLATE 5 MG PO TABS
5.0000 mg | ORAL_TABLET | Freq: Once | ORAL | Status: AC
  Administered 2016-11-06: 5 mg via ORAL

## 2016-11-06 NOTE — Discharge Instructions (Signed)
Results for orders placed or performed during the hospital encounter of 11/06/16  Basic metabolic panel  Result Value Ref Range   Sodium 141 135 - 145 mmol/L   Potassium 4.2 3.5 - 5.1 mmol/L   Chloride 107 101 - 111 mmol/L   CO2 27 22 - 32 mmol/L   Glucose, Bld 100 (H) 65 - 99 mg/dL   BUN 18 6 - 20 mg/dL   Creatinine, Ser 1.30 0.44 - 1.00 mg/dL   Calcium 8.4 (L) 8.9 - 10.3 mg/dL   GFR calc non Af Amer >60 >60 mL/min   GFR calc Af Amer >60 >60 mL/min   Anion gap 7 5 - 15  CBC with Differential  Result Value Ref Range   WBC 13.6 (H) 3.6 - 11.0 K/uL   RBC 2.89 (L) 3.80 - 5.20 MIL/uL   Hemoglobin 9.0 (L) 12.0 - 16.0 g/dL   HCT 86.5 (L) 78.4 - 69.6 %   MCV 92.7 80.0 - 100.0 fL   MCH 31.0 26.0 - 34.0 pg   MCHC 33.4 32.0 - 36.0 g/dL   RDW 29.5 28.4 - 13.2 %   Platelets 311 150 - 440 K/uL   Neutrophils Relative % 81 %   Neutro Abs 11.1 (H) 1.4 - 6.5 K/uL   Lymphocytes Relative 10 %   Lymphs Abs 1.3 1.0 - 3.6 K/uL   Monocytes Relative 9 %   Monocytes Absolute 1.2 (H) 0.2 - 0.9 K/uL   Eosinophils Relative 0 %   Eosinophils Absolute 0.0 0 - 0.7 K/uL   Basophils Relative 0 %   Basophils Absolute 0.1 0 - 0.1 K/uL   Dg Chest 1 View  Result Date: 11/06/2016 CLINICAL DATA:  Left hip pain after hearing a popping sound in physical therapy today. Recent right hip surgery. EXAM: CHEST 1 VIEW COMPARISON:  11/02/2016. FINDINGS: Enlarged cardiac silhouette with an interval increase in size. Tortuous aorta. Interval increased density at the medial right lung apex compatible with vascular prominence accentuated by increased patient rotation to the right. Small amount of linear density at the left lung base. The interstitial markings remain mildly prominent. Bilateral shoulder degenerative changes with superior migration of the humeral heads. Thoracic spine degenerative changes and mild scoliosis. IMPRESSION: 1. Interval cardiomegaly and mild left basilar linear atelectasis. 2. Stable mild chronic  interstitial lung disease. 3. Bilateral shoulder degenerative changes with superior migration of the humeral heads, compatible large, chronic bilateral rotator cuff tears. Electronically Signed   By: Beckie Salts M.D.   On: 11/06/2016 12:57   Dg Chest 1 View  Result Date: 11/02/2016 CLINICAL DATA:  Fall, injury EXAM: CHEST 1 VIEW COMPARISON:  06/08/2016 FINDINGS: Lungs are clear.  No pleural effusion or pneumothorax. The heart is normal in size. IMPRESSION: No evidence of acute cardiopulmonary disease. Electronically Signed   By: Charline Bills M.D.   On: 11/02/2016 11:18   Ct Head Wo Contrast  Result Date: 11/02/2016 CLINICAL DATA:  Fall, back pain EXAM: CT HEAD WITHOUT CONTRAST CT CERVICAL SPINE WITHOUT CONTRAST TECHNIQUE: Multidetector CT imaging of the head and cervical spine was performed following the standard protocol without intravenous contrast. Multiplanar CT image reconstructions of the cervical spine were also generated. COMPARISON:  02/06/2015 FINDINGS: CT HEAD FINDINGS Brain: There is atrophy and chronic small vessel disease changes. No acute intracranial abnormality. Specifically, no hemorrhage, hydrocephalus, mass lesion, acute infarction, or significant intracranial injury. Vascular: No hyperdense vessel or unexpected calcification. Skull: No acute calvarial abnormality. Sinuses/Orbits: Visualized paranasal sinuses and mastoids clear.  Orbital soft tissues unremarkable. Other: None CT CERVICAL SPINE FINDINGS Alignment: Slight anterolisthesis at multiple levels due to facet disease. Skull base and vertebrae: No fracture Soft tissues and spinal canal: Prevertebral soft tissues are normal. No epidural or paraspinal hematoma. Disc levels: Diffuse degenerative disc and facet disease throughout the cervical spine. Upper chest: No acute findings. Other: 1.4 cm nodule in the left thyroid lobe. IMPRESSION: No acute intracranial abnormality.Atrophy, chronic microvascular disease. Diffuse  degenerative disc and facet disease in the cervical spine. No acute bony abnormality. Small nodule in the left thyroid nodule. Electronically Signed   By: Charlett NoseKevin  Dover M.D.   On: 11/02/2016 11:49   Ct Cervical Spine Wo Contrast  Result Date: 11/02/2016 CLINICAL DATA:  83 year old female with dementia post fall. Initial encounter. EXAM: CT CERVICAL SPINE WITHOUT CONTRAST TECHNIQUE: Multidetector CT imaging of the cervical spine was performed without intravenous contrast. Multiplanar CT image reconstructions were also generated. COMPARISON:  02/06/2015. FINDINGS: Alignment: Similar to prior exam. Skull base and vertebrae: No cervical spine fracture. Soft tissues and spinal canal: No abnormal prevertebral soft tissue swelling. Disc levels: Degenerative changes C1-2 with transverse ligament hypertrophy. Cervical spondylotic changes with various degrees of spinal stenosis and foraminal narrowing minimally changed from prior exam. Upper chest: Scarring. Other: Heterogeneous thyroid gland with 1.2 cm thyroid nodule unchanged. Vascular calcifications. Remote left cerebellar fracture with encephalomalacia. IMPRESSION: No cervical spine fracture or abnormal prevertebral soft tissue swelling. Multilevel cervical spondylotic changes minimally changed from prior exam. Electronically Signed   By: Lacy DuverneySteven  Olson M.D.   On: 11/02/2016 12:08   Dg C-arm 1-60 Min  Result Date: 11/02/2016 CLINICAL DATA:  83 year old female with right intertrochanteric fracture. Subsequent encounter. EXAM: DG C-ARM 61-120 MIN; OPERATIVE RIGHT HIP WITH PELVIS COMPARISON:  11/02/2016. FINDINGS: Four intraoperative C-arm views submitted for review after surgery. Right femoral intramedullary rod with proximal sliding type screw and distal fixation screw utilized to transfix right intertrochanteric fracture without complication noted. IMPRESSION: Open reduction and internal fixation right intertrochanteric fracture. Electronically Signed   By: Lacy DuverneySteven   Olson M.D.   On: 11/02/2016 17:18   Dg Hip Operative Unilat With Pelvis Right  Result Date: 11/02/2016 CLINICAL DATA:  83 year old female with right intertrochanteric fracture. Subsequent encounter. EXAM: DG C-ARM 61-120 MIN; OPERATIVE RIGHT HIP WITH PELVIS COMPARISON:  11/02/2016. FINDINGS: Four intraoperative C-arm views submitted for review after surgery. Right femoral intramedullary rod with proximal sliding type screw and distal fixation screw utilized to transfix right intertrochanteric fracture without complication noted. IMPRESSION: Open reduction and internal fixation right intertrochanteric fracture. Electronically Signed   By: Lacy DuverneySteven  Olson M.D.   On: 11/02/2016 17:18   Dg Hip Unilat W Or Wo Pelvis 2-3 Views Left  Result Date: 11/06/2016 CLINICAL DATA:  Left hip pain after hearing a popping sound in physical therapy. Status post right hip surgery on 11/02/2016 for a fracture following a fall. EXAM: DG HIP (WITH OR WITHOUT PELVIS) 2-3V LEFT COMPARISON:  Right femur dated 11/02/2016. FINDINGS: Again demonstrated is compression screw and rod fixation of the right femur. No left hip fracture or dislocation seen. Diffuse osteopenia. Lower lumbar spine facet degenerative changes and laminectomy defects. Mild atheromatous arterial calcifications. IMPRESSION: No acute fracture or dislocation. Electronically Signed   By: Beckie SaltsSteven  Reid M.D.   On: 11/06/2016 12:54   Dg Femur Port, Min 2 Views Right  Result Date: 11/02/2016 CLINICAL DATA:  Postop films for right hip fracture. EXAM: RIGHT FEMUR PORTABLE 2 VIEW COMPARISON:  11/02/2016 FINDINGS: Placement of right femoral intramedullary  nail which extends to the mid diaphysis. There is a dynamic hip screw extending through the right femoral head and neck. Again noted is an intertrochanteric fracture with a displaced lesser trochanter. Soft tissue gas and skin staples as expected. Limited evaluation of the right hip joint on the cross-table lateral view.  Improved alignment of the proximal right femur. Joint space narrowing in the right knee. IMPRESSION: Internal fixation of the right femur fracture. No complicating features. Electronically Signed   By: Richarda OverlieAdam  Henn M.D.   On: 11/02/2016 17:01   Dg Hips Bilat W Or Wo Pelvis 3-4 Views  Result Date: 11/02/2016 CLINICAL DATA:  Right hip pain and frontal laceration after falling last night. Initial encounter. EXAM: DG HIP (WITH OR WITHOUT PELVIS) 3-4V BILAT COMPARISON:  AP pelvis 02/06/2015. FINDINGS: The bones are demineralized. There is an acute, mildly comminuted and moderately angulated intertrochanteric right femur fracture. The left proximal femur appears intact. No evidence of dislocation or pelvic fracture. IMPRESSION: Acute, moderately angulated right femoral intertrochanteric fracture. Electronically Signed   By: Carey BullocksWilliam  Veazey M.D.   On: 11/02/2016 11:19

## 2016-11-06 NOTE — ED Notes (Signed)
Patient was dried 

## 2016-11-06 NOTE — ED Notes (Signed)
Patient transported to X-ray 

## 2016-11-06 NOTE — ED Triage Notes (Signed)
[facility]. Per EMS pt was working with physical therapy and heard a "pop" and pt began to c/o of left hip pain. Pt hx of fall and [facility]. Pt alert and oriented to self, in no acute distress at this time

## 2016-11-06 NOTE — ED Provider Notes (Signed)
[facility] Emergency Department Provider Note  ____________________________________________  Time seen: Approximately 1:25 PM  I have reviewed the triage vital signs and the nursing notes.   HISTORY  Chief Complaint Leg Injury  Level 5 caveat:  Portions of the history and physical were unable to be obtained due to: poor historian due to dementia   HPI Patricia Daniels is a 83 y.o. female sent to the ED from acute rehabilitation where she is doing physical therapy for recent surgery on the right hip, when they heard a popping sound from the left hip. Patient was walking at the time, did not have any falls. Patient seemed uncomfortable at the time so they assisted her back to bed. Sent to ED for evaluation.     Past Medical History:  Diagnosis Date  . Arthritis    "pretty much all over" (11/03/2016)  . Coronary artery disease   . Dementia   . GERD (gastroesophageal reflux disease)   . Hypercholesteremia   . Hypertension   . Stroke [facility](HCC)    "mild one" ; son denies residual on 11/03/2016  . TIA (transient ischemic attack)      Patient Active Problem List   Diagnosis Date Noted  . Vitamin D deficiency 11/05/2016  . Pressure injury of skin 11/05/2016  . Closed displaced intertrochanteric fracture of right femur (HCC) 11/02/2016  . Dementia 11/02/2016  . Bradycardia 06/08/2016  . Fall 06/08/2016  . Closed fibular fracture 06/08/2016  . HTN (hypertension) 06/08/2016  . HLD (hyperlipidemia) 06/08/2016     Past Surgical History:  Procedure Laterality Date  . ABDOMINAL HYSTERECTOMY    . BACK SURGERY    . CHOLECYSTECTOMY    . CORONARY ANGIOPLASTY WITH STENT PLACEMENT     "has 2 in" (11/03/2016)  . FRACTURE SURGERY    . INTRAMEDULLARY (IM) NAIL INTERTROCHANTERIC Right 11/02/2016   Procedure: INTRAMEDULLARY (IM) NAIL INTERTROCHANTRIC;  Surgeon: Yolonda Kidaogers, Jason Patrick, MD;  [facility];  Service: Orthopedics;  Laterality: Right;  . SPINAL FUSION     "not sure where on her back" (11/03/2016)  . TONSILLECTOMY       Prior to Admission medications   Medication Sig Start Date End Date Taking? Authorizing Provider  acetaminophen (TYLENOL) 325 MG tablet Take 2 tablets (650 mg total) by mouth every 6 (six) hours as needed for mild pain (or Fever >/= 101). 06/11/16  Yes Auburn BilberryPatel, Shreyang, MD  aspirin EC 81 MG tablet Take 1 tablet (81 mg total) by mouth 2 (two) times daily. Patient taking differently: Take 325 mg by mouth 2 (two) times daily.  11/02/16 12/02/16 Yes Yolonda Kidaogers, Jason Patrick, MD  azelastine (OPTIVAR) 0.05 % ophthalmic solution Place 2 drops into both eyes 2 (two) times daily as needed. irritation   Yes [provider]  donepezil (ARICEPT) 10 MG tablet Take 10 mg by mouth at bedtime.   Yes [provider]  ergocalciferol (VITAMIN D2) 50000 units capsule Take 1 capsule (50,000 Units total) by mouth once a week. Patient taking differently: Take 50,000 Units by mouth once a week. Every Monday. 11/05/16 01/22/17 Yes Chundi, Vahini, MD  lisinopril (PRINIVIL,ZESTRIL) 5 MG tablet Take 2 tablets (10 mg total) by mouth daily. 06/11/16  Yes Auburn BilberryPatel, Shreyang, MD  metoprolol succinate (TOPROL-XL) 25 MG 24 hr tablet Take 25 mg by mouth at bedtime. 09/24/16  Yes [provider]  montelukast (SINGULAIR) 10 MG tablet Take 10 mg by mouth at bedtime. 04/26/16  Yes [provider]  nystatin cream (MYCOSTATIN)  Apply 1 application topically 2 (two) times daily as needed for rash. 10/28/16  Yes [provider]  pantoprazole (PROTONIX) 40 MG tablet Take 40 mg by mouth daily. 05/20/16  Yes [provider]  topiramate (TOPAMAX) 25 MG tablet Take 25 mg by mouth at bedtime.   Yes [provider]  traMADol (ULTRAM) 50 MG tablet Take 1-2 tablets (50-100 mg total) by mouth every 6 (six) hours as needed. 11/02/16 11/02/17 Yes Yolonda Kidaogers, Jason Patrick, MD     Allergies Codeine   Family History  Problem Relation Age of  Onset  . Family history unknown: Yes    Social History Social History  Substance Use Topics  . Smoking status: Never Smoker  . Smokeless tobacco: Never Used  . Alcohol use No    Review of Systems  Constitutional:   No fever or chills.  ENT:   No sore throat. No rhinorrhea. Cardiovascular:   No chest pain or syncope. Respiratory:   No dyspnea or cough. Gastrointestinal:   Negative for abdominal pain, vomiting and diarrhea.  Musculoskeletal:   Bilateral hip pain All other systems reviewed and are negative except as documented above in ROS and HPI.  ____________________________________________   PHYSICAL EXAM:  VITAL SIGNS: ED Triage Vitals  Enc Vitals Group     BP 11/06/16 1209 (!) 163/59     Pulse Rate 11/06/16 1209 62     Resp 11/06/16 1209 20     Temp 11/06/16 1209 (!) 97.5 F (36.4 C)     Temp Source 11/06/16 1209 Axillary     SpO2 11/06/16 1209 98 %     Weight 11/06/16 1211 132 lb (59.9 kg)     Height 11/06/16 1211 5\' 2"  (1.575 m)     Head Circumference --      Peak Flow --      Pain Score --      Pain Loc --      Pain Edu? --      Excl. in GC? --     Vital signs reviewed, nursing assessments reviewed.   Constitutional:   Alert and orientedTo self. Well appearing and in no distress. Eyes:   No scleral icterus.  EOMI. No nystagmus. No conjunctival pallor. PERRL. ENT   Head:   Normocephalic and atraumatic.   Nose:   No congestion/rhinnorhea.    Mouth/Throat:   Dry mucous membranes, no pharyngeal erythema. No peritonsillar mass.    Neck:   No meningismus. Full ROM Hematological/Lymphatic/Immunilogical:   No cervical lymphadenopathy. Cardiovascular:   RRR. Symmetric bilateral radial and DP pulses.  No murmurs.  Respiratory:   Normal respiratory effort without tachypnea/retractions. Breath sounds are clear and equal bilaterally. No wheezes/rales/rhonchi. Gastrointestinal:   Soft and nontender. Non distended. There is no CVA tenderness.  No  rebound, rigidity, or guarding. Genitourinary:   deferred Musculoskeletal:   Normal range of motion in all extremities. No joint effusions.  Slight nonfocal tenderness about the right hip. No inflammatory changes in the soft tissues in recent surgical area. Left hip nontender. No other focal bony tenderness. Neurologic:   Normal speech .  Motor grossly intact. No gross focal neurologic deficits are appreciated.  Skin:    Skin is warm, dry and intact. No rash noted.  No petechiae, purpura, or bullae.  ____________________________________________    LABS (pertinent positives/negatives) (all labs ordered are listed, but only abnormal results are displayed) Labs Reviewed  BASIC METABOLIC PANEL - Abnormal; Notable for the following:  Result Value   Glucose, Bld 100 (*)    Calcium 8.4 (*)    All other components within normal limits  CBC WITH DIFFERENTIAL/PLATELET - Abnormal; Notable for the following:    WBC 13.6 (*)    RBC 2.89 (*)    Hemoglobin 9.0 (*)    HCT 26.8 (*)    Neutro Abs 11.1 (*)    Monocytes Absolute 1.2 (*)    All other components within normal limits   ____________________________________________   EKG    ____________________________________________    RADIOLOGY  Dg Chest 1 View  Result Date: 11/06/2016 CLINICAL DATA:  Left hip pain after hearing a popping sound in physical therapy today. Recent right hip surgery. EXAM: CHEST 1 VIEW COMPARISON:  11/02/2016. FINDINGS: Enlarged cardiac silhouette with an interval increase in size. Tortuous aorta. Interval increased density at the medial right lung apex compatible with vascular prominence accentuated by increased patient rotation to the right. Small amount of linear density at the left lung base. The interstitial markings remain mildly prominent. Bilateral shoulder degenerative changes with superior migration of the humeral heads. Thoracic spine degenerative changes and mild scoliosis. IMPRESSION: 1. Interval  cardiomegaly and mild left basilar linear atelectasis. 2. Stable mild chronic interstitial lung disease. 3. Bilateral shoulder degenerative changes with superior migration of the humeral heads, compatible large, chronic bilateral rotator cuff tears. Electronically Signed   By: Beckie Salts M.D.   On: 11/06/2016 12:57   Dg Hip Unilat W Or Wo Pelvis 2-3 Views Left  Result Date: 11/06/2016 CLINICAL DATA:  Left hip pain after hearing a popping sound in physical therapy. Status post right hip surgery on 11/02/2016 for a fracture following a fall. EXAM: DG HIP (WITH OR WITHOUT PELVIS) 2-3V LEFT COMPARISON:  Right femur dated 11/02/2016. FINDINGS: Again demonstrated is compression screw and rod fixation of the right femur. No left hip fracture or dislocation seen. Diffuse osteopenia. Lower lumbar spine facet degenerative changes and laminectomy defects. Mild atheromatous arterial calcifications. IMPRESSION: No acute fracture or dislocation. Electronically Signed   By: Beckie Salts M.D.   On: 11/06/2016 12:54    ____________________________________________   PROCEDURES Procedures  ____________________________________________   INITIAL IMPRESSION / ASSESSMENT AND PLAN / ED COURSE  Pertinent labs & imaging results that were available during my care of the patient were reviewed by me and considered in my medical decision making (see chart for details).  Patient sent ED for evaluation after staff at the rehabilitation heard a popping sound that they thought was possibly due to a acute left hip injury. X-rays are negative, exam is unremarkable. Patient's calm and comfortable. Clinically the patient appears to be mildly dehydrated and was given IV fluids. Electrolytes and renal function are stable. Discharge home follow up with primary care.      ____________________________________________   FINAL CLINICAL IMPRESSION(S) / ED DIAGNOSES  Final diagnoses:  Bilateral hip pain  Dementia without  behavioral disturbance, unspecified dementia type      New Prescriptions   No medications on file     Portions of this note were generated with dragon dictation software. Dictation errors may occur despite best attempts at proofreading.    Sharman Cheek, MD 11/06/16 1328

## 2016-11-18 ENCOUNTER — Other Ambulatory Visit: Payer: Self-pay | Admitting: *Deleted
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
## 2016-11-26 ENCOUNTER — Other Ambulatory Visit: Payer: Self-pay | Admitting: *Deleted

## 2016-11-26 NOTE — Patient Outreach (Signed)
[facility]) Care Management  11/26/2016  TIMIKA MUENCH 07/31/1919 [id number]  Met with administrator, she confirms that patient will remain LTC in facility.  No [facility] care management needs at this time.  Plan to sign off. Royetta Crochet. Laymond Purser, RN, BSN, [place] [phone]) Business Cell  [phone]) Toll Free Office

## 2016-12-04 DIAGNOSIS — M25551 Pain in right hip: Secondary | ICD-10-CM | POA: Diagnosis not present

## 2016-12-04 DIAGNOSIS — M79651 Pain in right thigh: Secondary | ICD-10-CM | POA: Diagnosis not present

## 2016-12-06 DIAGNOSIS — R6889 Other general symptoms and signs: Secondary | ICD-10-CM | POA: Diagnosis not present

## 2016-12-10 DIAGNOSIS — M25561 Pain in right knee: Secondary | ICD-10-CM | POA: Diagnosis not present

## 2016-12-10 DIAGNOSIS — M79651 Pain in right thigh: Secondary | ICD-10-CM | POA: Diagnosis not present

## 2016-12-10 DIAGNOSIS — M25551 Pain in right hip: Secondary | ICD-10-CM | POA: Diagnosis not present

## 2016-12-16 DIAGNOSIS — S72141D Displaced intertrochanteric fracture of right femur, subsequent encounter for closed fracture with routine healing: Secondary | ICD-10-CM | POA: Diagnosis not present

## 2016-12-18 DIAGNOSIS — I10 Essential (primary) hypertension: Secondary | ICD-10-CM | POA: Diagnosis not present

## 2016-12-18 DIAGNOSIS — F0390 Unspecified dementia without behavioral disturbance: Secondary | ICD-10-CM | POA: Diagnosis not present

## 2016-12-18 DIAGNOSIS — Z0000 Encounter for general adult medical examination without abnormal findings: Secondary | ICD-10-CM | POA: Diagnosis not present

## 2016-12-18 DIAGNOSIS — I350 Nonrheumatic aortic (valve) stenosis: Secondary | ICD-10-CM | POA: Diagnosis not present

## 2016-12-18 DIAGNOSIS — M8000XD Age-related osteoporosis with current pathological fracture, unspecified site, subsequent encounter for fracture with routine healing: Secondary | ICD-10-CM | POA: Diagnosis not present

## 2017-01-05 DIAGNOSIS — F0390 Unspecified dementia without behavioral disturbance: Secondary | ICD-10-CM | POA: Diagnosis not present

## 2017-01-05 DIAGNOSIS — F339 Major depressive disorder, recurrent, unspecified: Secondary | ICD-10-CM | POA: Diagnosis not present

## 2017-01-05 DIAGNOSIS — F39 Unspecified mood [affective] disorder: Secondary | ICD-10-CM | POA: Diagnosis not present

## 2017-01-20 DIAGNOSIS — M6281 Muscle weakness (generalized): Secondary | ICD-10-CM | POA: Diagnosis not present

## 2017-01-20 DIAGNOSIS — R2689 Other abnormalities of gait and mobility: Secondary | ICD-10-CM | POA: Diagnosis not present

## 2017-01-21 DIAGNOSIS — R2689 Other abnormalities of gait and mobility: Secondary | ICD-10-CM | POA: Diagnosis not present

## 2017-01-21 DIAGNOSIS — M6281 Muscle weakness (generalized): Secondary | ICD-10-CM | POA: Diagnosis not present

## 2017-01-22 DIAGNOSIS — M6281 Muscle weakness (generalized): Secondary | ICD-10-CM | POA: Diagnosis not present

## 2017-01-22 DIAGNOSIS — R2689 Other abnormalities of gait and mobility: Secondary | ICD-10-CM | POA: Diagnosis not present

## 2017-01-25 DIAGNOSIS — M6281 Muscle weakness (generalized): Secondary | ICD-10-CM | POA: Diagnosis not present

## 2017-01-25 DIAGNOSIS — R2689 Other abnormalities of gait and mobility: Secondary | ICD-10-CM | POA: Diagnosis not present

## 2017-01-26 DIAGNOSIS — M6281 Muscle weakness (generalized): Secondary | ICD-10-CM | POA: Diagnosis not present

## 2017-01-26 DIAGNOSIS — R2689 Other abnormalities of gait and mobility: Secondary | ICD-10-CM | POA: Diagnosis not present

## 2017-01-27 DIAGNOSIS — M6281 Muscle weakness (generalized): Secondary | ICD-10-CM | POA: Diagnosis not present

## 2017-01-27 DIAGNOSIS — R2689 Other abnormalities of gait and mobility: Secondary | ICD-10-CM | POA: Diagnosis not present

## 2017-01-28 DIAGNOSIS — R2689 Other abnormalities of gait and mobility: Secondary | ICD-10-CM | POA: Diagnosis not present

## 2017-01-28 DIAGNOSIS — M6281 Muscle weakness (generalized): Secondary | ICD-10-CM | POA: Diagnosis not present

## 2017-01-29 DIAGNOSIS — R2689 Other abnormalities of gait and mobility: Secondary | ICD-10-CM | POA: Diagnosis not present

## 2017-01-29 DIAGNOSIS — M6281 Muscle weakness (generalized): Secondary | ICD-10-CM | POA: Diagnosis not present

## 2017-02-01 DIAGNOSIS — M6281 Muscle weakness (generalized): Secondary | ICD-10-CM | POA: Diagnosis not present

## 2017-02-01 DIAGNOSIS — R2689 Other abnormalities of gait and mobility: Secondary | ICD-10-CM | POA: Diagnosis not present

## 2017-02-02 DIAGNOSIS — R2689 Other abnormalities of gait and mobility: Secondary | ICD-10-CM | POA: Diagnosis not present

## 2017-02-02 DIAGNOSIS — M6281 Muscle weakness (generalized): Secondary | ICD-10-CM | POA: Diagnosis not present

## 2017-02-03 DIAGNOSIS — M6281 Muscle weakness (generalized): Secondary | ICD-10-CM | POA: Diagnosis not present

## 2017-02-03 DIAGNOSIS — R2689 Other abnormalities of gait and mobility: Secondary | ICD-10-CM | POA: Diagnosis not present

## 2017-02-04 DIAGNOSIS — M6281 Muscle weakness (generalized): Secondary | ICD-10-CM | POA: Diagnosis not present

## 2017-02-04 DIAGNOSIS — R2689 Other abnormalities of gait and mobility: Secondary | ICD-10-CM | POA: Diagnosis not present

## 2017-02-05 DIAGNOSIS — R2689 Other abnormalities of gait and mobility: Secondary | ICD-10-CM | POA: Diagnosis not present

## 2017-02-05 DIAGNOSIS — M6281 Muscle weakness (generalized): Secondary | ICD-10-CM | POA: Diagnosis not present

## 2017-02-08 DIAGNOSIS — M6281 Muscle weakness (generalized): Secondary | ICD-10-CM | POA: Diagnosis not present

## 2017-02-08 DIAGNOSIS — R2689 Other abnormalities of gait and mobility: Secondary | ICD-10-CM | POA: Diagnosis not present

## 2017-02-09 DIAGNOSIS — R2689 Other abnormalities of gait and mobility: Secondary | ICD-10-CM | POA: Diagnosis not present

## 2017-02-09 DIAGNOSIS — M6281 Muscle weakness (generalized): Secondary | ICD-10-CM | POA: Diagnosis not present

## 2017-02-15 DIAGNOSIS — Z79899 Other long term (current) drug therapy: Secondary | ICD-10-CM | POA: Diagnosis not present

## 2017-02-15 DIAGNOSIS — E559 Vitamin D deficiency, unspecified: Secondary | ICD-10-CM | POA: Diagnosis not present

## 2017-02-15 DIAGNOSIS — D649 Anemia, unspecified: Secondary | ICD-10-CM | POA: Diagnosis not present

## 2017-02-16 ENCOUNTER — Emergency Department
Admission: EM | Admit: 2017-02-16 | Discharge: 2017-02-16 | Disposition: A | Discharge status: Home / Self Care | Payer: Medicare HMO | Attending: Emergency Medicine | Admitting: Emergency Medicine

## 2017-02-16 ENCOUNTER — Encounter: Payer: Self-pay | Admitting: Emergency Medicine

## 2017-02-16 ENCOUNTER — Emergency Department: Payer: Medicare HMO

## 2017-02-16 DIAGNOSIS — Z7401 Bed confinement status: Secondary | ICD-10-CM | POA: Diagnosis not present

## 2017-02-16 DIAGNOSIS — S72141D Displaced intertrochanteric fracture of right femur, subsequent encounter for closed fracture with routine healing: Secondary | ICD-10-CM | POA: Diagnosis not present

## 2017-02-16 DIAGNOSIS — Z79899 Other long term (current) drug therapy: Secondary | ICD-10-CM | POA: Diagnosis not present

## 2017-02-16 DIAGNOSIS — M25551 Pain in right hip: Secondary | ICD-10-CM

## 2017-02-16 DIAGNOSIS — Z955 Presence of coronary angioplasty implant and graft: Secondary | ICD-10-CM | POA: Insufficient documentation

## 2017-02-16 DIAGNOSIS — I2510 Atherosclerotic heart disease of native coronary artery without angina pectoris: Secondary | ICD-10-CM | POA: Insufficient documentation

## 2017-02-16 DIAGNOSIS — S79911A Unspecified injury of right hip, initial encounter: Secondary | ICD-10-CM | POA: Diagnosis not present

## 2017-02-16 DIAGNOSIS — W07XXXA Fall from chair, initial encounter: Secondary | ICD-10-CM | POA: Diagnosis not present

## 2017-02-16 DIAGNOSIS — S8991XA Unspecified injury of right lower leg, initial encounter: Secondary | ICD-10-CM | POA: Diagnosis not present

## 2017-02-16 DIAGNOSIS — M545 Low back pain, unspecified: Secondary | ICD-10-CM

## 2017-02-16 DIAGNOSIS — R4182 Altered mental status, unspecified: Secondary | ICD-10-CM | POA: Diagnosis not present

## 2017-02-16 DIAGNOSIS — W19XXXA Unspecified fall, initial encounter: Secondary | ICD-10-CM

## 2017-02-16 DIAGNOSIS — I10 Essential (primary) hypertension: Secondary | ICD-10-CM | POA: Diagnosis not present

## 2017-02-16 DIAGNOSIS — S0990XA Unspecified injury of head, initial encounter: Secondary | ICD-10-CM | POA: Diagnosis not present

## 2017-02-16 DIAGNOSIS — M79604 Pain in right leg: Secondary | ICD-10-CM | POA: Diagnosis not present

## 2017-02-16 DIAGNOSIS — F0390 Unspecified dementia without behavioral disturbance: Secondary | ICD-10-CM | POA: Insufficient documentation

## 2017-02-16 DIAGNOSIS — Z4789 Encounter for other orthopedic aftercare: Secondary | ICD-10-CM | POA: Diagnosis not present

## 2017-02-16 DIAGNOSIS — M79661 Pain in right lower leg: Secondary | ICD-10-CM | POA: Diagnosis not present

## 2017-02-16 DIAGNOSIS — S3992XA Unspecified injury of lower back, initial encounter: Secondary | ICD-10-CM | POA: Diagnosis not present

## 2017-02-16 NOTE — ED Notes (Addendum)
[facility], [place]  contacted at this time to make aware of pt location and upcoming possible discharge. Per DON pt unable to go with [facility] due to incident that happened prior to ED visit , charge RN made aware pt will have to go back via EMS

## 2017-02-16 NOTE — ED Notes (Addendum)
Pt had mechnical fall today, c/o Rt knee pain, abrasions noted. Pt A&O x2, pt in NAD. Pt moved to room close to nurses station and fall alarm placed on pt at this time
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
## 2017-02-16 NOTE — ED Notes (Signed)
Pt transporting to [facility] via EMS at this time, pt in NAD at time of d/c, VS stable. Pt unable to sign due to hx of dementia.

## 2017-02-16 NOTE — Discharge Instructions (Signed)
All imaging performed was unremarkable for acute injury, fracture, dislocation, subluxation or bleeding.  Patient should follow-up with her orthopedic provider at her next scheduled appointment.  If symptoms persist or worsen do not hesitate to return to the emergency department.

## 2017-02-16 NOTE — ED Notes (Signed)
Emergency contact, son tried to contact at this time with no answer. Will continue

## 2017-02-17 DIAGNOSIS — Z23 Encounter for immunization: Secondary | ICD-10-CM | POA: Diagnosis not present

## 2017-02-20 DIAGNOSIS — M25532 Pain in left wrist: Secondary | ICD-10-CM | POA: Diagnosis not present

## 2017-02-24 DIAGNOSIS — I10 Essential (primary) hypertension: Secondary | ICD-10-CM | POA: Diagnosis not present

## 2017-02-24 DIAGNOSIS — I350 Nonrheumatic aortic (valve) stenosis: Secondary | ICD-10-CM | POA: Diagnosis not present

## 2017-02-24 DIAGNOSIS — F0390 Unspecified dementia without behavioral disturbance: Secondary | ICD-10-CM | POA: Diagnosis not present

## 2017-02-24 DIAGNOSIS — M8000XD Age-related osteoporosis with current pathological fracture, unspecified site, subsequent encounter for fracture with routine healing: Secondary | ICD-10-CM | POA: Diagnosis not present

## 2017-03-02 DIAGNOSIS — Z79899 Other long term (current) drug therapy: Secondary | ICD-10-CM | POA: Diagnosis not present

## 2017-04-23 ENCOUNTER — Encounter: Payer: Self-pay | Admitting: Nurse Practitioner

## 2017-04-30 DIAGNOSIS — I10 Essential (primary) hypertension: Secondary | ICD-10-CM | POA: Diagnosis not present

## 2017-04-30 DIAGNOSIS — R630 Anorexia: Secondary | ICD-10-CM | POA: Diagnosis not present

## 2017-04-30 DIAGNOSIS — F0390 Unspecified dementia without behavioral disturbance: Secondary | ICD-10-CM | POA: Diagnosis not present

## 2017-04-30 DIAGNOSIS — E559 Vitamin D deficiency, unspecified: Secondary | ICD-10-CM | POA: Diagnosis not present

## 2017-05-06 DIAGNOSIS — I10 Essential (primary) hypertension: Secondary | ICD-10-CM | POA: Diagnosis not present

## 2017-05-06 DIAGNOSIS — R001 Bradycardia, unspecified: Secondary | ICD-10-CM | POA: Diagnosis not present

## 2017-05-06 DIAGNOSIS — Z0000 Encounter for general adult medical examination without abnormal findings: Secondary | ICD-10-CM | POA: Diagnosis not present

## 2017-05-06 DIAGNOSIS — I350 Nonrheumatic aortic (valve) stenosis: Secondary | ICD-10-CM | POA: Diagnosis not present

## 2017-05-06 DIAGNOSIS — F0390 Unspecified dementia without behavioral disturbance: Secondary | ICD-10-CM | POA: Diagnosis not present

## 2017-05-29 DIAGNOSIS — R21 Rash and other nonspecific skin eruption: Secondary | ICD-10-CM | POA: Diagnosis not present

## 2017-06-02 DIAGNOSIS — I350 Nonrheumatic aortic (valve) stenosis: Secondary | ICD-10-CM | POA: Diagnosis not present

## 2017-06-02 DIAGNOSIS — F0390 Unspecified dementia without behavioral disturbance: Secondary | ICD-10-CM | POA: Diagnosis not present

## 2017-06-02 DIAGNOSIS — I10 Essential (primary) hypertension: Secondary | ICD-10-CM | POA: Diagnosis not present

## 2017-06-02 DIAGNOSIS — M810 Age-related osteoporosis without current pathological fracture: Secondary | ICD-10-CM | POA: Diagnosis not present

## 2017-06-11 DIAGNOSIS — E43 Unspecified severe protein-calorie malnutrition: Secondary | ICD-10-CM | POA: Diagnosis not present

## 2017-06-28 DIAGNOSIS — M810 Age-related osteoporosis without current pathological fracture: Secondary | ICD-10-CM | POA: Diagnosis not present

## 2017-06-28 DIAGNOSIS — I10 Essential (primary) hypertension: Secondary | ICD-10-CM | POA: Diagnosis not present

## 2017-06-28 DIAGNOSIS — F0390 Unspecified dementia without behavioral disturbance: Secondary | ICD-10-CM | POA: Diagnosis not present

## 2017-06-28 DIAGNOSIS — I350 Nonrheumatic aortic (valve) stenosis: Secondary | ICD-10-CM | POA: Diagnosis not present

## 2017-07-12 DIAGNOSIS — E43 Unspecified severe protein-calorie malnutrition: Secondary | ICD-10-CM | POA: Diagnosis not present

## 2017-07-15 DIAGNOSIS — E43 Unspecified severe protein-calorie malnutrition: Secondary | ICD-10-CM | POA: Diagnosis not present

## 2017-07-15 DIAGNOSIS — W19XXXD Unspecified fall, subsequent encounter: Secondary | ICD-10-CM | POA: Diagnosis not present

## 2017-07-15 DIAGNOSIS — S72141D Displaced intertrochanteric fracture of right femur, subsequent encounter for closed fracture with routine healing: Secondary | ICD-10-CM | POA: Diagnosis not present

## 2017-07-15 DIAGNOSIS — F0390 Unspecified dementia without behavioral disturbance: Secondary | ICD-10-CM | POA: Diagnosis not present

## 2017-08-11 DIAGNOSIS — E43 Unspecified severe protein-calorie malnutrition: Secondary | ICD-10-CM | POA: Diagnosis not present

## 2017-08-23 DIAGNOSIS — F0391 Unspecified dementia with behavioral disturbance: Secondary | ICD-10-CM | POA: Diagnosis not present

## 2017-08-23 DIAGNOSIS — I10 Essential (primary) hypertension: Secondary | ICD-10-CM | POA: Diagnosis not present

## 2017-08-23 DIAGNOSIS — R630 Anorexia: Secondary | ICD-10-CM | POA: Diagnosis not present

## 2017-08-23 DIAGNOSIS — M1990 Unspecified osteoarthritis, unspecified site: Secondary | ICD-10-CM | POA: Diagnosis not present

## 2017-08-24 DIAGNOSIS — I739 Peripheral vascular disease, unspecified: Secondary | ICD-10-CM | POA: Diagnosis not present

## 2017-08-24 DIAGNOSIS — B351 Tinea unguium: Secondary | ICD-10-CM | POA: Diagnosis not present

## 2017-09-09 DIAGNOSIS — F0390 Unspecified dementia without behavioral disturbance: Secondary | ICD-10-CM | POA: Diagnosis not present

## 2017-09-09 DIAGNOSIS — H04123 Dry eye syndrome of bilateral lacrimal glands: Secondary | ICD-10-CM | POA: Diagnosis not present

## 2017-09-09 DIAGNOSIS — I10 Essential (primary) hypertension: Secondary | ICD-10-CM | POA: Diagnosis not present

## 2017-09-09 DIAGNOSIS — H16213 Exposure keratoconjunctivitis, bilateral: Secondary | ICD-10-CM | POA: Diagnosis not present

## 2017-09-09 DIAGNOSIS — R001 Bradycardia, unspecified: Secondary | ICD-10-CM | POA: Diagnosis not present

## 2017-09-09 DIAGNOSIS — H353131 Nonexudative age-related macular degeneration, bilateral, early dry stage: Secondary | ICD-10-CM | POA: Diagnosis not present

## 2017-09-09 DIAGNOSIS — I350 Nonrheumatic aortic (valve) stenosis: Secondary | ICD-10-CM | POA: Diagnosis not present

## 2017-09-09 DIAGNOSIS — H02139 Senile ectropion of unspecified eye, unspecified eyelid: Secondary | ICD-10-CM | POA: Diagnosis not present

## 2017-09-11 DIAGNOSIS — E43 Unspecified severe protein-calorie malnutrition: Secondary | ICD-10-CM | POA: Diagnosis not present

## 2017-09-14 DIAGNOSIS — E43 Unspecified severe protein-calorie malnutrition: Secondary | ICD-10-CM | POA: Diagnosis not present

## 2017-09-14 DIAGNOSIS — W19XXXD Unspecified fall, subsequent encounter: Secondary | ICD-10-CM | POA: Diagnosis not present

## 2017-09-14 DIAGNOSIS — F0390 Unspecified dementia without behavioral disturbance: Secondary | ICD-10-CM | POA: Diagnosis not present

## 2017-09-14 DIAGNOSIS — S72141D Displaced intertrochanteric fracture of right femur, subsequent encounter for closed fracture with routine healing: Secondary | ICD-10-CM | POA: Diagnosis not present

## 2017-09-29 DIAGNOSIS — I10 Essential (primary) hypertension: Secondary | ICD-10-CM | POA: Diagnosis not present

## 2017-09-29 DIAGNOSIS — F0390 Unspecified dementia without behavioral disturbance: Secondary | ICD-10-CM | POA: Diagnosis not present

## 2017-09-29 DIAGNOSIS — R630 Anorexia: Secondary | ICD-10-CM | POA: Diagnosis not present

## 2017-09-29 DIAGNOSIS — M2550 Pain in unspecified joint: Secondary | ICD-10-CM | POA: Diagnosis not present

## 2017-10-11 DIAGNOSIS — E43 Unspecified severe protein-calorie malnutrition: Secondary | ICD-10-CM | POA: Diagnosis not present

## 2017-11-11 DIAGNOSIS — E43 Unspecified severe protein-calorie malnutrition: Secondary | ICD-10-CM | POA: Diagnosis not present

## 2017-11-16 DIAGNOSIS — F321 Major depressive disorder, single episode, moderate: Secondary | ICD-10-CM | POA: Diagnosis not present

## 2017-11-17 DIAGNOSIS — F0390 Unspecified dementia without behavioral disturbance: Secondary | ICD-10-CM | POA: Diagnosis not present

## 2017-11-17 DIAGNOSIS — L600 Ingrowing nail: Secondary | ICD-10-CM | POA: Diagnosis not present

## 2017-11-17 DIAGNOSIS — M810 Age-related osteoporosis without current pathological fracture: Secondary | ICD-10-CM | POA: Diagnosis not present

## 2017-11-17 DIAGNOSIS — B351 Tinea unguium: Secondary | ICD-10-CM | POA: Diagnosis not present

## 2017-11-17 DIAGNOSIS — I10 Essential (primary) hypertension: Secondary | ICD-10-CM | POA: Diagnosis not present

## 2017-11-17 DIAGNOSIS — S90221A Contusion of right lesser toe(s) with damage to nail, initial encounter: Secondary | ICD-10-CM | POA: Diagnosis not present

## 2017-11-17 DIAGNOSIS — L603 Nail dystrophy: Secondary | ICD-10-CM | POA: Diagnosis not present

## 2017-11-17 DIAGNOSIS — I739 Peripheral vascular disease, unspecified: Secondary | ICD-10-CM | POA: Diagnosis not present

## 2017-11-17 DIAGNOSIS — I350 Nonrheumatic aortic (valve) stenosis: Secondary | ICD-10-CM | POA: Diagnosis not present

## 2017-11-18 DIAGNOSIS — D649 Anemia, unspecified: Secondary | ICD-10-CM | POA: Diagnosis not present

## 2017-11-18 DIAGNOSIS — Z79899 Other long term (current) drug therapy: Secondary | ICD-10-CM | POA: Diagnosis not present

## 2017-12-01 ENCOUNTER — Emergency Department
Admission: EM | Admit: 2017-12-01 | Discharge: 2017-12-01 | Disposition: A | Discharge status: Skilled Nursing Facility | Payer: Medicare Other | Attending: Emergency Medicine | Admitting: Emergency Medicine

## 2017-12-01 ENCOUNTER — Emergency Department: Payer: Medicare Other

## 2017-12-01 ENCOUNTER — Other Ambulatory Visit: Payer: Self-pay

## 2017-12-01 DIAGNOSIS — I2510 Atherosclerotic heart disease of native coronary artery without angina pectoris: Secondary | ICD-10-CM | POA: Diagnosis not present

## 2017-12-01 DIAGNOSIS — F0390 Unspecified dementia without behavioral disturbance: Secondary | ICD-10-CM | POA: Insufficient documentation

## 2017-12-01 DIAGNOSIS — Y9384 Activity, sleeping: Secondary | ICD-10-CM | POA: Diagnosis not present

## 2017-12-01 DIAGNOSIS — Y999 Unspecified external cause status: Secondary | ICD-10-CM | POA: Diagnosis not present

## 2017-12-01 DIAGNOSIS — S0993XA Unspecified injury of face, initial encounter: Secondary | ICD-10-CM | POA: Diagnosis present

## 2017-12-01 DIAGNOSIS — S199XXA Unspecified injury of neck, initial encounter: Secondary | ICD-10-CM | POA: Diagnosis not present

## 2017-12-01 DIAGNOSIS — Z79899 Other long term (current) drug therapy: Secondary | ICD-10-CM | POA: Insufficient documentation

## 2017-12-01 DIAGNOSIS — W06XXXA Fall from bed, initial encounter: Secondary | ICD-10-CM | POA: Insufficient documentation

## 2017-12-01 DIAGNOSIS — Z9181 History of falling: Secondary | ICD-10-CM | POA: Diagnosis not present

## 2017-12-01 DIAGNOSIS — Y92122 Bedroom in nursing home as the place of occurrence of the external cause: Secondary | ICD-10-CM | POA: Diagnosis not present

## 2017-12-01 DIAGNOSIS — Z955 Presence of coronary angioplasty implant and graft: Secondary | ICD-10-CM | POA: Insufficient documentation

## 2017-12-01 DIAGNOSIS — W19XXXA Unspecified fall, initial encounter: Secondary | ICD-10-CM | POA: Diagnosis not present

## 2017-12-01 DIAGNOSIS — I10 Essential (primary) hypertension: Secondary | ICD-10-CM | POA: Diagnosis not present

## 2017-12-01 DIAGNOSIS — S0003XA Contusion of scalp, initial encounter: Secondary | ICD-10-CM | POA: Diagnosis not present

## 2017-12-01 DIAGNOSIS — G40919 Epilepsy, unspecified, intractable, without status epilepticus: Secondary | ICD-10-CM | POA: Diagnosis not present

## 2017-12-01 DIAGNOSIS — S0990XA Unspecified injury of head, initial encounter: Secondary | ICD-10-CM | POA: Diagnosis not present

## 2017-12-01 DIAGNOSIS — S0181XA Laceration without foreign body of other part of head, initial encounter: Secondary | ICD-10-CM

## 2017-12-01 MED ORDER — LIDOCAINE HCL (PF) 1 % IJ SOLN
INTRAMUSCULAR | Status: AC
  Administered 2017-12-01: 07:00:00
  Filled 2017-12-01: qty 5

## 2017-12-01 NOTE — ED Provider Notes (Signed)
[facility] Emergency Department Provider Note   First MD Initiated Contact with Patient 12/01/17 [phone]     (approximate)  I have reviewed the triage vital signs and the nursing notes.   HISTORY  Chief Complaint Fall and Facial Laceration    HPI Patricia Daniels is a 84 y.o. female both chronic medical conditions including dementia presents to the emergency department via EMS following an unwitnessed fall at [facility] this morning.  Patient states she fell out of bed.  Patient denies any complaints at present.  Past Medical History:  Diagnosis Date  . Arthritis    "pretty much all over" (11/03/2016)  . Coronary artery disease   . Dementia   . GERD (gastroesophageal reflux disease)   . Hypercholesteremia   . Hypertension   . Stroke [facility])    "mild one" ; son denies residual on 11/03/2016  . TIA (transient ischemic attack)     Patient Active Problem List   Diagnosis Date Noted  . Vitamin D deficiency 11/05/2016  . Pressure injury of skin 11/05/2016  . Closed displaced intertrochanteric fracture of right femur (HCC) 11/02/2016  . Dementia 11/02/2016  . Bradycardia 06/08/2016  . Fall 06/08/2016  . Closed fibular fracture 06/08/2016  . HTN (hypertension) 06/08/2016  . HLD (hyperlipidemia) 06/08/2016    Past Surgical History:  Procedure Laterality Date  . ABDOMINAL HYSTERECTOMY    . BACK SURGERY    . CHOLECYSTECTOMY    . CORONARY ANGIOPLASTY WITH STENT PLACEMENT     "has 2 in" (11/03/2016)  . FRACTURE SURGERY    . INTRAMEDULLARY (IM) NAIL INTERTROCHANTERIC Right 11/02/2016   Procedure: INTRAMEDULLARY (IM) NAIL INTERTROCHANTRIC;  Surgeon: Yolonda Kida, MD;  Location: [facility] OR;  Service: Orthopedics;  Laterality: Right;  . SPINAL FUSION     "not sure where on her back" (11/03/2016)  . TONSILLECTOMY      Prior to Admission medications   Medication Sig Start Date End Date Taking? Authorizing Provider  acetaminophen (TYLENOL) 325 MG tablet  Take 2 tablets (650 mg total) by mouth every 6 (six) hours as needed for mild pain (or Fever >/= 101). 06/11/16   [name], MD  azelastine (OPTIVAR) 0.05 % ophthalmic solution Place 2 drops into both eyes 2 (two) times daily as needed. irritation    [provider]  donepezil (ARICEPT) 10 MG tablet Take 10 mg by mouth at bedtime.    [provider]  lisinopril (PRINIVIL,ZESTRIL) 5 MG tablet Take 2 tablets (10 mg total) by mouth daily. 06/11/16   [name], MD  metoprolol succinate (TOPROL-XL) 25 MG 24 hr tablet Take 25 mg by mouth at bedtime. 09/24/16   [provider]  montelukast (SINGULAIR) 10 MG tablet Take 10 mg by mouth at bedtime. 04/26/16   [provider]  nystatin cream (MYCOSTATIN) Apply 1 application topically 2 (two) times daily as needed for rash. 10/28/16   [provider]  pantoprazole (PROTONIX) 40 MG tablet Take 40 mg by mouth daily. 05/20/16   [provider]  topiramate (TOPAMAX) 25 MG tablet Take 25 mg by mouth at bedtime.    [provider]    Allergies Codeine  Family History  Family history unknown: Yes    Social History Social History   Tobacco Use  . Smoking status: Never Smoker  . Smokeless tobacco: Never Used  Substance Use Topics  . Alcohol use: No  . Drug use: No    Review of Systems Constitutional: No fever/chills  Eyes: No visual changes. ENT: No sore throat. Cardiovascular: Denies chest pain. Respiratory: Denies shortness of breath. Gastrointestinal: No abdominal pain.  No nausea, no vomiting.  No diarrhea.  No constipation. Genitourinary: Negative for dysuria. Musculoskeletal: Negative for neck pain.  Negative for back pain. Integumentary: Negative for rash.  3 cm linear left frontal laceration superior to the eyebrow Neurological: Negative for headaches, focal weakness or numbness.   ____________________________________________   PHYSICAL EXAM:  VITAL SIGNS: ED Triage  Vitals  Enc Vitals Group     BP 12/01/17 0534 (!) 156/87     Pulse Rate 12/01/17 0534 68     Resp 12/01/17 0534 20     Temp 12/01/17 0534 97.8 F (36.6 C)     Temp Source 12/01/17 0534 Oral     SpO2 12/01/17 0534 98 %     Weight 12/01/17 0531 55 kg (121 lb 4.1 oz)     Height 12/01/17 0531 1.549 m (5\' 1" )     Head Circumference --      Peak Flow --      Pain Score 12/01/17 0541 8     Pain Loc --      Pain Edu? --      Excl. in GC? --     Constitutional: Alert and  Well appearing and in no acute distress. Eyes: Conjunctivae are normal. PERRL. EOMI. Head: Atraumatic. Mouth/Throat: Mucous membranes are moist.  Oropharynx non-erythematous. Neck: No stridor.  No meningeal signs.  No cervical spine tenderness to palpation. Cardiovascular: Normal rate, regular rhythm. Good peripheral circulation. Grossly normal heart sounds. Respiratory: Normal respiratory effort.  No retractions. Lungs CTAB. Gastrointestinal: Soft and nontender. No distention.  Musculoskeletal: No lower extremity tenderness nor edema. No gross deformities of extremities. Neurologic:  No gross focal neurologic deficits are appreciated.  Skin: 3 cm linear left frontal laceration superior to the eyebrow   ____________________________________________   LABS (all labs ordered are listed, but only abnormal results are displayed)  Labs Reviewed - No data to display ____________________________________________  EKG  ED ECG REPORT I, Marion N Sheridan Gettel, the attending physician, personally viewed and interpreted this ECG.   Date: 12/02/2017  EKG Time: 30 8 AM  Rate: 62  Rhythm: Normal sinus rhythm  Axis: Normal  Intervals: Normal  ST&T Change: None  ____________________________________________  RADIOLOGY I, [place] N Joeziah Voit, personally viewed and evaluated these images (plain radiographs) as part of my medical decision making, as well as reviewing the written report by the radiologist.  ED MD interpretation:  Left anterior frontal soft tissue swelling noted no other acute intracranial findings per radiologist.   ..Laceration Repair Date/Time: 12/02/2017 10:10 PM Performed by: Darci CurrentBrown, Irwin N, MD Authorized by: Darci CurrentBrown, Fedora N, MD   Consent:    Consent obtained:  Verbal   Consent given by:  Patient   Risks discussed:  Infection, pain, retained foreign body, poor cosmetic result and poor wound healing Anesthesia (see MAR for exact dosages):    Anesthesia method:  Local infiltration   Local anesthetic:  Lidocaine 1% w/o epi Laceration details:    Location:  Face   Face location:  Forehead   Length (cm):  3   Depth (mm):  10 Repair type:    Repair type:  Simple Exploration:    Hemostasis achieved with:  Direct pressure   Wound exploration: entire depth of wound probed and visualized     Contaminated: no   Treatment:    Area cleansed with:  Saline   Amount of  cleaning:  Extensive   Irrigation solution:  Sterile saline   Visualized foreign bodies/material removed: no   Skin repair:    Repair method:  Sutures   Suture size:  6-0   Suture material:  Nylon   Suture technique:  Simple interrupted   Number of sutures:  3 Approximation:    Approximation:  Close Post-procedure details:    Dressing:  Sterile dressing   Patient tolerance of procedure:  Tolerated well, no immediate complications     ____________________________________________   INITIAL IMPRESSION / ASSESSMENT AND PLAN / ED COURSE  As part of my medical decision making, I reviewed the following data within the electronic MEDICAL RECORD NUMBER   84 year old female presenting with above-stated history and physical exam secondary to fall.  Patient had a 3 cm wound on the left frontal portion which was repaired without difficulty.  CT scan of the head cervical spine revealed no acute intracranial findings or any acute bony abnormality. ____________________________________________  FINAL CLINICAL IMPRESSION(S) / ED  DIAGNOSES  Final diagnoses:  Laceration of forehead, initial encounter     MEDICATIONS GIVEN DURING THIS VISIT:  Medications  lidocaine (PF) (XYLOCAINE) 1 % injection (  Given by Other 12/01/17 0630)     ED Discharge Orders    None       Note:  This document was prepared using Dragon voice recognition software and may include unintentional dictation errors.Darci Current\    Maher Shon, [place] N, MD 12/02/17 2211

## 2017-12-01 NOTE — ED Notes (Signed)
Pt returned to ED Rm 10 from CT at this time. 

## 2017-12-01 NOTE — ED Triage Notes (Addendum)
Pt arrives to ED via [facility] from [facility] s/p unwitnessed fall. EMS states facility last seen her around 10pm in bed w/o any c/o's at that time. Pt states she cannot recall details prior to or following fall, unknown LOC. Pt arrives with a 1-2cm laceration above the left eye; no bleeding at this time.

## 2017-12-12 DIAGNOSIS — E43 Unspecified severe protein-calorie malnutrition: Secondary | ICD-10-CM | POA: Diagnosis not present

## 2018-01-11 DIAGNOSIS — E43 Unspecified severe protein-calorie malnutrition: Secondary | ICD-10-CM | POA: Diagnosis not present

## 2018-01-17 DIAGNOSIS — I10 Essential (primary) hypertension: Secondary | ICD-10-CM | POA: Diagnosis not present

## 2018-01-17 DIAGNOSIS — M1990 Unspecified osteoarthritis, unspecified site: Secondary | ICD-10-CM | POA: Diagnosis not present

## 2018-01-17 DIAGNOSIS — F0390 Unspecified dementia without behavioral disturbance: Secondary | ICD-10-CM | POA: Diagnosis not present

## 2018-01-17 DIAGNOSIS — N182 Chronic kidney disease, stage 2 (mild): Secondary | ICD-10-CM | POA: Diagnosis not present

## 2018-01-19 DIAGNOSIS — F0390 Unspecified dementia without behavioral disturbance: Secondary | ICD-10-CM | POA: Diagnosis not present

## 2018-01-19 DIAGNOSIS — I10 Essential (primary) hypertension: Secondary | ICD-10-CM | POA: Diagnosis not present

## 2018-01-19 DIAGNOSIS — R001 Bradycardia, unspecified: Secondary | ICD-10-CM | POA: Diagnosis not present

## 2018-01-19 DIAGNOSIS — I350 Nonrheumatic aortic (valve) stenosis: Secondary | ICD-10-CM | POA: Diagnosis not present

## 2018-01-19 DIAGNOSIS — Z789 Other specified health status: Secondary | ICD-10-CM | POA: Diagnosis not present

## 2018-04-04 DIAGNOSIS — Z789 Other specified health status: Secondary | ICD-10-CM | POA: Diagnosis not present

## 2018-04-04 DIAGNOSIS — I350 Nonrheumatic aortic (valve) stenosis: Secondary | ICD-10-CM | POA: Diagnosis not present

## 2018-04-04 DIAGNOSIS — F0390 Unspecified dementia without behavioral disturbance: Secondary | ICD-10-CM | POA: Diagnosis not present

## 2018-04-04 DIAGNOSIS — I10 Essential (primary) hypertension: Secondary | ICD-10-CM | POA: Diagnosis not present

## 2018-04-04 DIAGNOSIS — M810 Age-related osteoporosis without current pathological fracture: Secondary | ICD-10-CM | POA: Diagnosis not present

## 2018-04-08 DIAGNOSIS — I10 Essential (primary) hypertension: Secondary | ICD-10-CM | POA: Diagnosis not present

## 2018-04-08 DIAGNOSIS — F0390 Unspecified dementia without behavioral disturbance: Secondary | ICD-10-CM | POA: Diagnosis not present

## 2018-04-08 DIAGNOSIS — M1990 Unspecified osteoarthritis, unspecified site: Secondary | ICD-10-CM | POA: Diagnosis not present

## 2018-04-08 DIAGNOSIS — N182 Chronic kidney disease, stage 2 (mild): Secondary | ICD-10-CM | POA: Diagnosis not present

## 2018-04-20 DIAGNOSIS — W19XXXD Unspecified fall, subsequent encounter: Secondary | ICD-10-CM | POA: Diagnosis not present

## 2018-04-20 DIAGNOSIS — S72141D Displaced intertrochanteric fracture of right femur, subsequent encounter for closed fracture with routine healing: Secondary | ICD-10-CM | POA: Diagnosis not present

## 2018-04-20 DIAGNOSIS — G311 Senile degeneration of brain, not elsewhere classified: Secondary | ICD-10-CM | POA: Diagnosis not present

## 2018-04-20 DIAGNOSIS — E43 Unspecified severe protein-calorie malnutrition: Secondary | ICD-10-CM | POA: Diagnosis not present

## 2018-04-20 DIAGNOSIS — F0390 Unspecified dementia without behavioral disturbance: Secondary | ICD-10-CM | POA: Diagnosis not present

## 2018-05-14 DEATH — deceased

## 2018-05-26 IMAGING — CT CT HEAD W/O CM
3 series · 14 of 44 positions shown, 16 images · non-contrast
Comparison: 11/02/2016 CT head.

CLINICAL DATA: [AGE]/o  F; status post fall.

EXAM:
CT HEAD WITHOUT CONTRAST
TECHNIQUE: Contiguous axial images were obtained from the base of the skull
through the vertex without intravenous contrast.

[Series 2: head wo · axial · 0.47mm/px · z∈[-80,+30]mm · 8 of 27 slices shown, 10 images]
[im 3/27  brain]
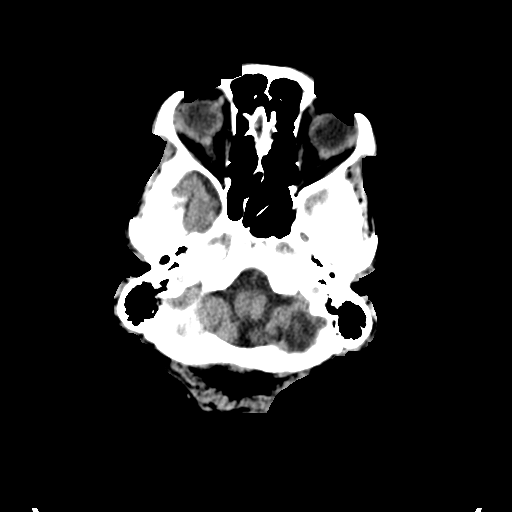
[im 3/27  bone]
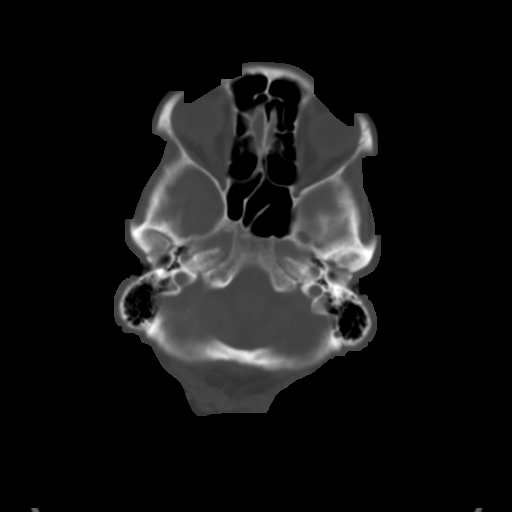
[im 6/27  brain]
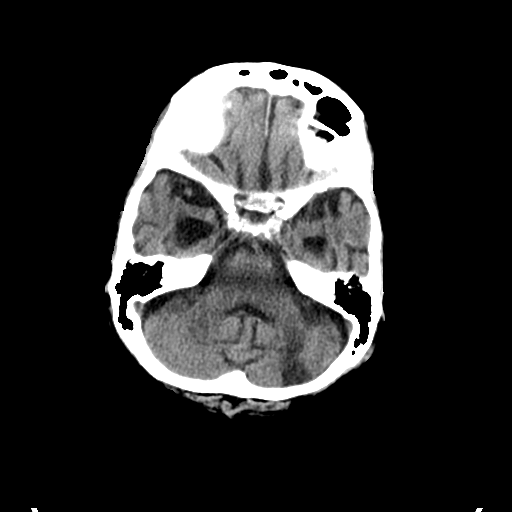
[im 9/27  brain]
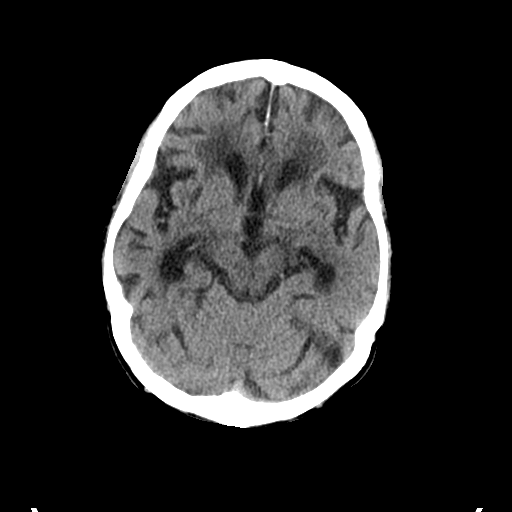
[im 12/27  brain]
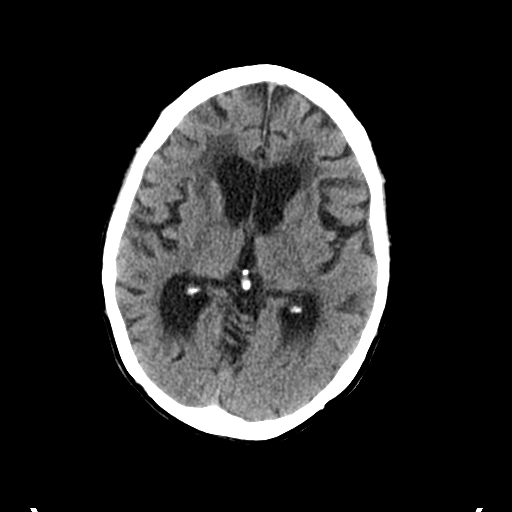
[im 16/27  brain]
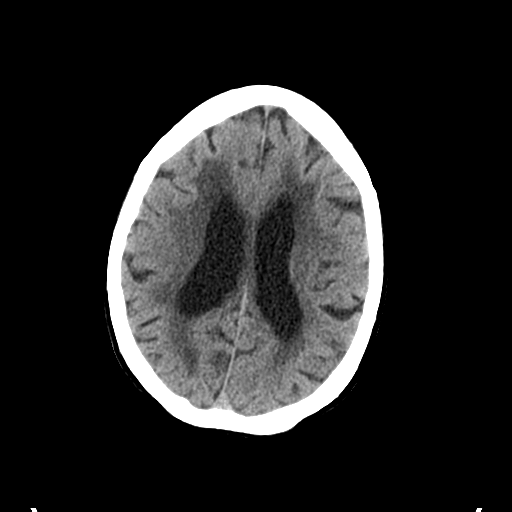
[im 16/27  bone]
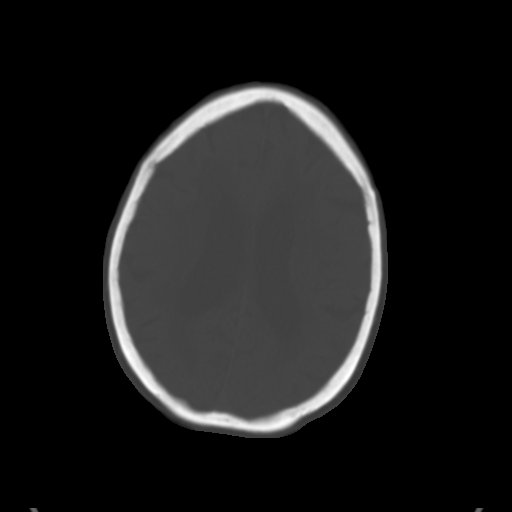
[im 19/27  brain]
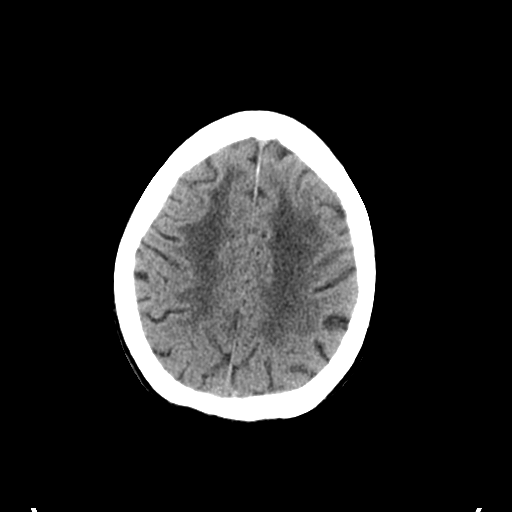
[im 22/27  brain]
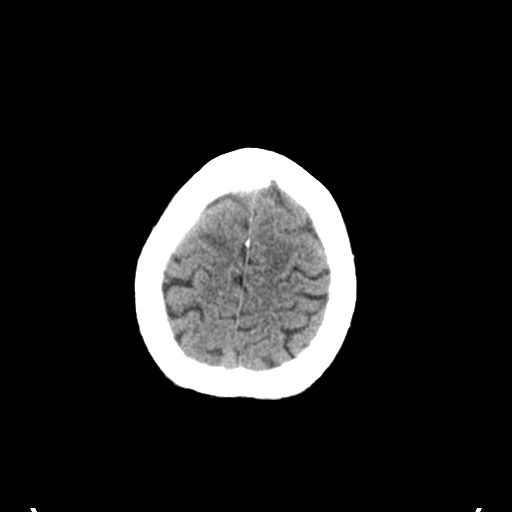
[im 25/27  brain]
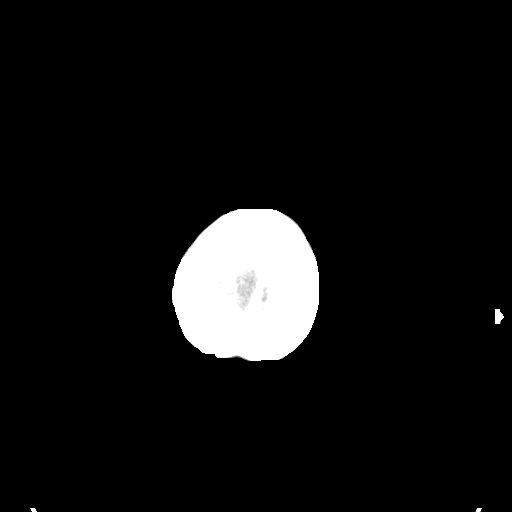

[Series 4: coronal soft tissue · coronal · 0.26mm/px · 3 of 59 slices shown]
[im 20/59  brain]
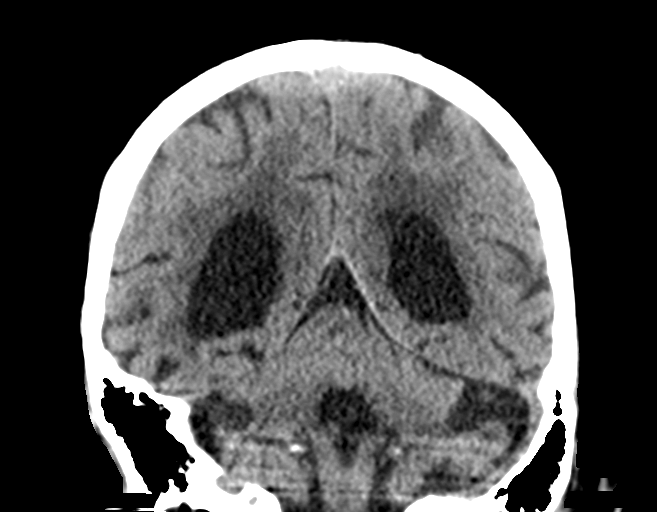
[im 26/59  brain]
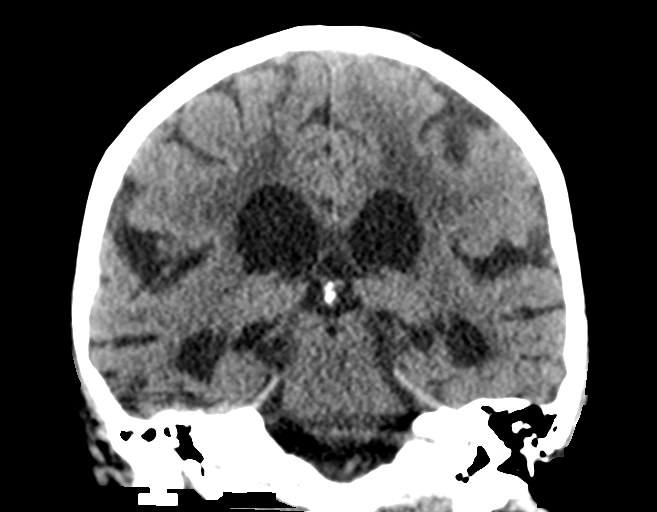
[im 33/59  brain]
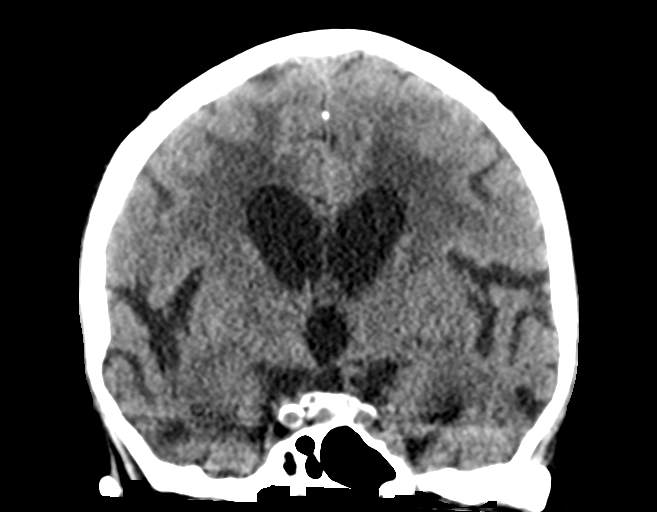

[Series 5: sagittal soft tissue · sagittal · 0.27mm/px · 3 of 46 slices shown]
[im 16/46  brain]
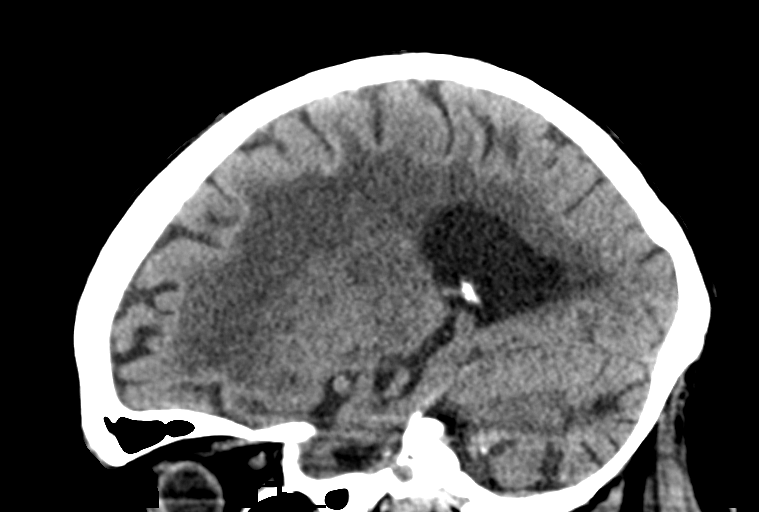
[im 23/46  brain]
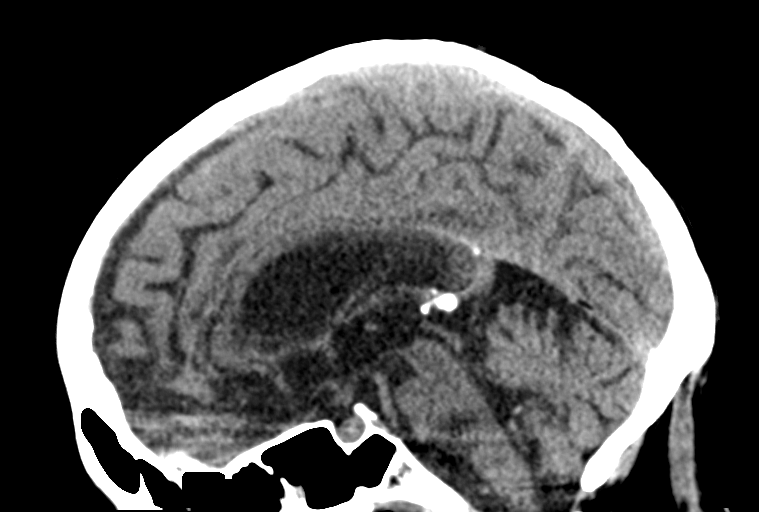
[im 31/46  brain]
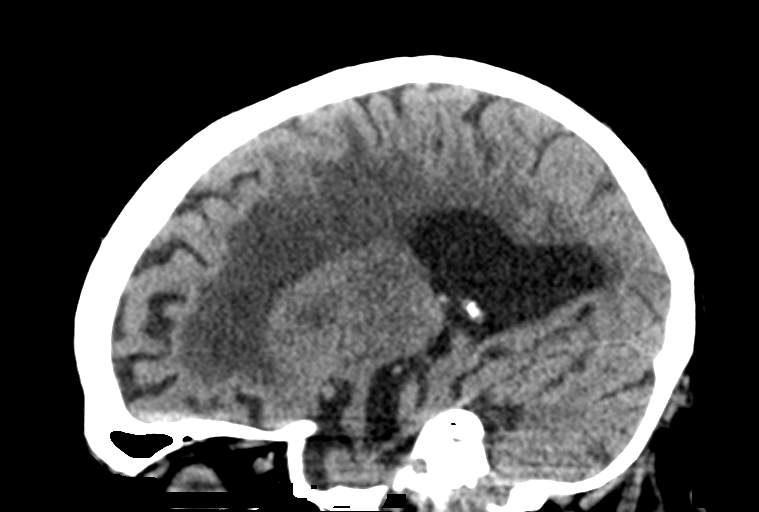

[14 of 44 positions shown; findings below may reference images not displayed]

FINDINGS: Brain: No evidence of acute infarction, hemorrhage, hydrocephalus,
extra-axial collection or mass lesion/mass effect. Stable left
inferior cerebellar chronic infarction. Small chronic infarction
within the right anterior insula. Stable chronic microvascular
ischemic changes and parenchymal volume loss of the brain.

Vascular: Calcific atherosclerosis of carotid siphons. No hyperdense
vessel identified.

Skull: Normal. Negative for fracture or focal lesion.

Sinuses/Orbits: No acute finding.

Other: None.
IMPRESSION: 1. No acute intracranial abnormality or calvarial fracture
identified.
2. Stable chronic microvascular ischemic changes and parenchymal
volume loss of the brain. Stable chronic left inferior cerebellar
infarction.

By: Matsuyama Ryckebusch M.D.

## 2018-05-26 IMAGING — CR DG TIBIA/FIBULA 2V*R*
2 series · 2 of 2 positions shown · non-contrast
Comparison: None.

CLINICAL DATA: Pain following fall

EXAM:
RIGHT TIBIA AND FIBULA - 2 VIEW

[tibia ap]
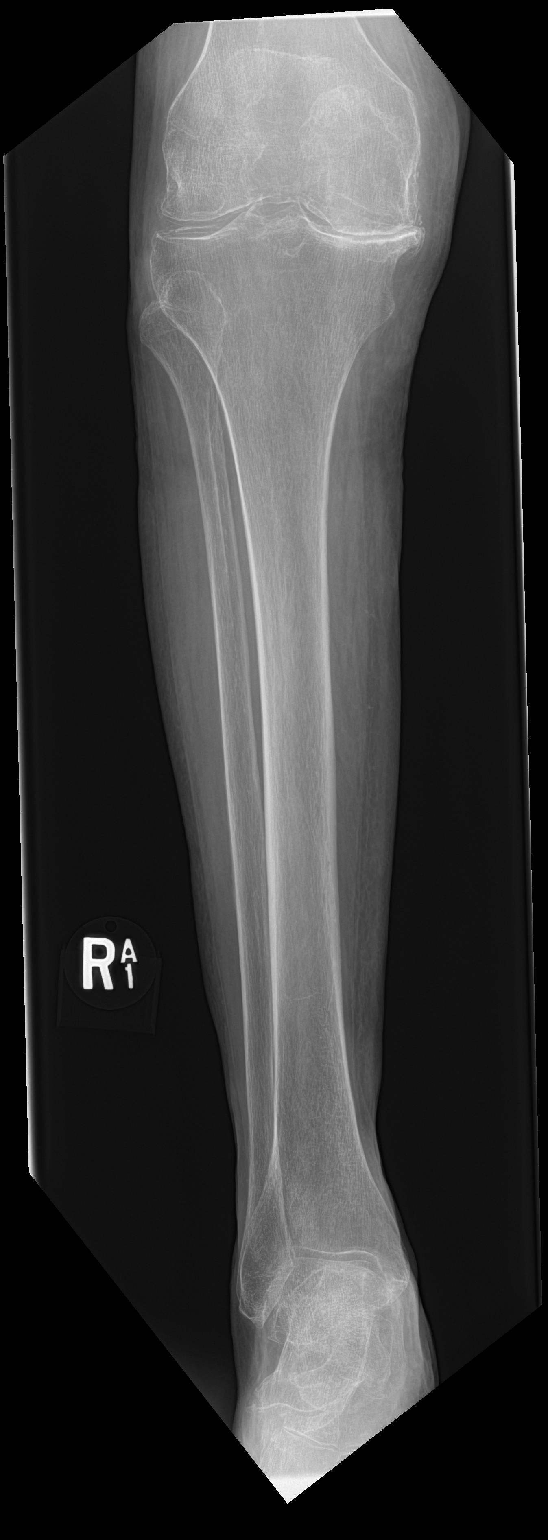

[tibia lat]
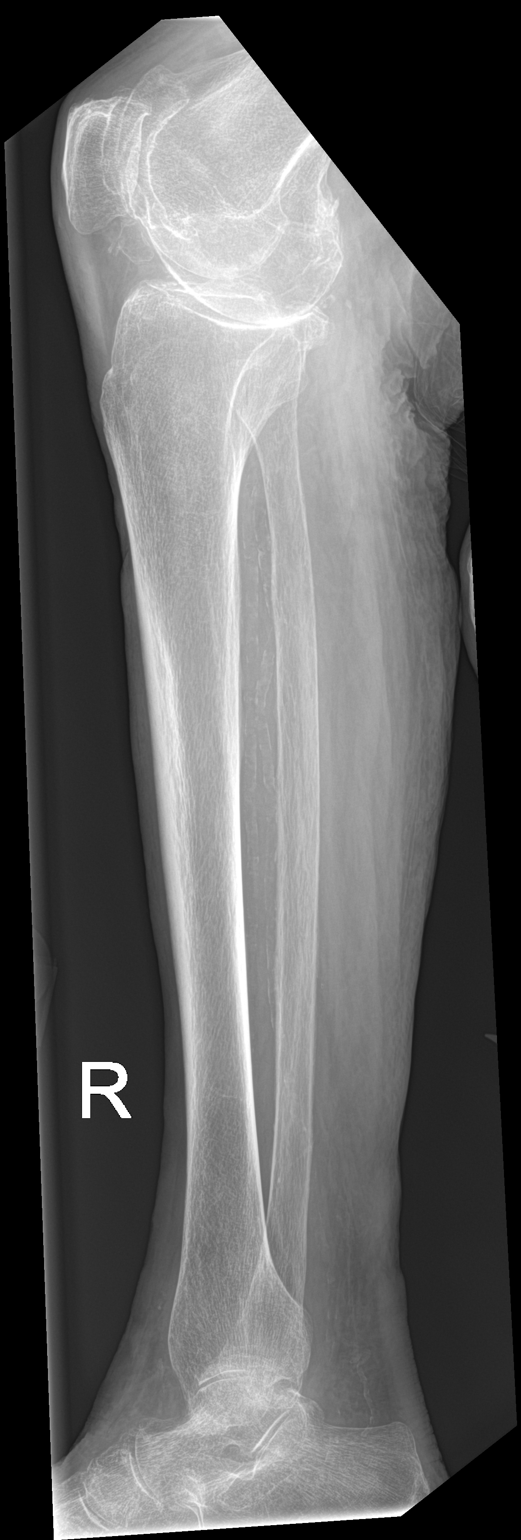

[2 of 2 positions shown; findings below may reference images not displayed]

FINDINGS: Frontal and lateral views were obtained. Bones are osteoporotic.
There is no demonstrable fracture or dislocation. No abnormal
periosteal action. There is marked narrowing in the medial aspect of
the knee joint. No knee joint effusion evident. There is
trifurcation artery atherosclerosis.
IMPRESSION: No fracture or dislocation. Bones osteoporotic. Extensive
arthropathy noted in the right knee joint. There is trifurcation
artery atherosclerosis.
